# Patient Record
Sex: Male | Born: 1950 | Race: White | Hispanic: No | Marital: Married | Smoking: Never smoker
Health system: Southern US, Community
[De-identification: ages and names within clinical notes are randomized; demographics above are authoritative.]

## PROBLEM LIST (undated history)

## (undated) DIAGNOSIS — Z789 Other specified health status: Secondary | ICD-10-CM

## (undated) DIAGNOSIS — E785 Hyperlipidemia, unspecified: Secondary | ICD-10-CM

## (undated) DIAGNOSIS — I219 Acute myocardial infarction, unspecified: Secondary | ICD-10-CM

## (undated) DIAGNOSIS — I2510 Atherosclerotic heart disease of native coronary artery without angina pectoris: Secondary | ICD-10-CM

## (undated) HISTORY — DX: Atherosclerotic heart disease of native coronary artery without angina pectoris: I25.10

## (undated) HISTORY — DX: Hyperlipidemia, unspecified: E78.5

---
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
## 2002-10-05 ENCOUNTER — Ambulatory Visit (HOSPITAL_COMMUNITY): Admission: RE | Admit: 2002-10-05 | Discharge: 2002-10-05 | Payer: Self-pay | Admitting: Internal Medicine

## 2006-03-28 ENCOUNTER — Emergency Department (HOSPITAL_COMMUNITY): Admission: EM | Admit: 2006-03-28 | Discharge: 2006-03-28 | Payer: Self-pay | Admitting: Emergency Medicine

## 2012-02-02 ENCOUNTER — Encounter (HOSPITAL_COMMUNITY): Payer: Self-pay

## 2012-02-02 ENCOUNTER — Emergency Department (HOSPITAL_COMMUNITY): Payer: Federal, State, Local not specified - PPO

## 2012-02-02 ENCOUNTER — Emergency Department (HOSPITAL_COMMUNITY)
Admission: EM | Admit: 2012-02-02 | Discharge: 2012-02-02 | Disposition: A | Discharge status: Home / Self Care | Payer: Federal, State, Local not specified - PPO | Attending: Emergency Medicine | Admitting: Emergency Medicine

## 2012-02-02 DIAGNOSIS — M79609 Pain in unspecified limb: Secondary | ICD-10-CM | POA: Insufficient documentation

## 2012-02-02 DIAGNOSIS — M722 Plantar fascial fibromatosis: Secondary | ICD-10-CM

## 2012-02-02 MED ORDER — HYDROCODONE-ACETAMINOPHEN 5-325 MG PO TABS: 1.0000 | ORAL_TABLET | Freq: Four times a day (QID) | ORAL | Status: AC | PRN

## 2012-02-02 MED ORDER — NAPROXEN 500 MG PO TABS: 500.0000 mg | ORAL_TABLET | Freq: Two times a day (BID) | ORAL | Status: DC

## 2012-02-02 NOTE — ED Notes (Signed)
Was getting off tractor today and thinks he may have stepped wrong, cont. To have right foot pain when he "moves certain ways"

## 2012-02-02 NOTE — ED Provider Notes (Signed)
History  This chart was scribed for Philip Kras, MD by Ladona Ridgel Day. This patient was seen in room APFT22/APFT22 and the patient's care was started at 1411.  CSN: [id number]  Arrival date & time 02/02/12  1359   First MD Initiated Contact with Patient 02/02/12 1411      Chief Complaint  Patient presents with  . Foot Pain    Patient is a 61 y.o. male presenting with lower extremity pain. The history is provided by the patient. No language interpreter was used.  Foot Pain   Philip Daniels is a 61 y.o. male who presents to the Emergency Department complaining of sudden sharp right foot pain after he stepped off his tractor wrong. He denies numbness in his foot or any other injury or illnesses at this time.  History reviewed. No pertinent past medical history.  Past Surgical History  Procedure Date  . Tonsillectomy     No family history on file.  History  Substance Use Topics  . Smoking status: Never Smoker   . Smokeless tobacco: Not on file  . Alcohol Use: No      Review of Systems  Musculoskeletal:       Right dorsal foot pain.   Neurological: Negative for numbness.    Allergies  Review of patient's allergies indicates no known allergies.  Home Medications  No current outpatient prescriptions on file.  Triage Vitals: BP 143/72  Pulse 60  Temp 98.3 F (36.8 C) (Oral)  Resp 20  Ht 5\' 10"  (1.778 m)  Wt 160 lb (72.576 kg)  BMI 22.96 kg/m2  SpO2 100%  Physical Exam  Nursing note and vitals reviewed. Constitutional: He appears well-developed and well-nourished. No distress.  HENT:  Head: Normocephalic and atraumatic.  Right Ear: External ear normal.  Left Ear: External ear normal.  Eyes: Conjunctivae are normal. Right eye exhibits no discharge. Left eye exhibits no discharge. No scleral icterus.  Neck: Neck supple. No tracheal deviation present.  Cardiovascular: Normal rate.   Pulmonary/Chest: Effort normal. No stridor. No respiratory distress.    Musculoskeletal: He exhibits no edema.       Right ankle: Normal. no tenderness.       Right foot: He exhibits tenderness (mild plantar fascia region). He exhibits no bony tenderness and no swelling.  Neurological: He is alert. Cranial nerve deficit: no gross deficits.  Skin: Skin is warm and dry. No rash noted.  Psychiatric: He has a normal mood and affect.    ED Course  Procedures (including critical care time) DIAGNOSTIC STUDIES: Oxygen Saturation is 100% on room air, normal by my interpretation.    COORDINATION OF CARE: At 33 Discussed treatment plan with patient which includes right foot X-ray. Patient agrees.   Labs Reviewed - No data to display Dg Foot Complete Right  02/02/2012  *RADIOLOGY REPORT*  Clinical Data: Pain at bottom of foot with weightbearing.  Twisting injury this morning.  RIGHT FOOT COMPLETE - 3+ VIEW  Comparison: None.  Findings: No acute fracture or dislocation.  Definite soft tissue swelling.  IMPRESSION: No acute osseous abnormality.  Original Report Authenticated By: Consuello Bossier, M.D.       MDM   no evidence of fracture or dislocation on his x-rays. Symptoms may be associated with a plantar fascia strain. Patient will be prescribed anti-inflammatory agents and I will provide him crutches to help rest the area.  Patient can follow up with an orthopedist as needed if the symptoms do not resolve over  the next week or so. I personally performed the services described in this documentation, which was scribed in my presence.  The recorded information has been reviewed and considered.       Philip Kras, MD 02/02/12 [phone]

## 2012-02-02 NOTE — ED Notes (Signed)
Pt reports stepped down off of a tractor tire and started having pain in top and bottom of r foot.   Foot warm to touch, pulse present, can wiggle toes, and capillary refill WNL.  No obvious swelling noted.

## 2012-02-02 NOTE — ED Notes (Signed)
Patient reports he has crutches at home in good condition that he used for at previous injury.

## 2012-11-04 ENCOUNTER — Telehealth: Payer: Self-pay

## 2012-11-04 NOTE — Telephone Encounter (Signed)
Pt called today because he got a letter in the mail to set-up a TCS. He is not having any problems.

## 2012-11-04 NOTE — Telephone Encounter (Signed)
Tried to call pt and phone number listed is not a working number.

## 2012-11-04 NOTE — Telephone Encounter (Signed)
Called cell number of [phone]. Pt had last colonoscopy 10/05/2002 by NUR. He was not aware that NUR was no longer here. I told him our doctors would be glad to do it if he would like or I could give him NUR's number if he preferred. He said he was not where he could write a umber down now, he will think about it and call me back. He was on the recall list for next colonoscopy.

## 2012-11-06 ENCOUNTER — Encounter (INDEPENDENT_AMBULATORY_CARE_PROVIDER_SITE_OTHER): Payer: Self-pay

## 2012-11-06 ENCOUNTER — Telehealth (INDEPENDENT_AMBULATORY_CARE_PROVIDER_SITE_OTHER): Payer: Self-pay | Admitting: *Deleted

## 2012-11-06 ENCOUNTER — Other Ambulatory Visit (INDEPENDENT_AMBULATORY_CARE_PROVIDER_SITE_OTHER): Payer: Self-pay | Admitting: *Deleted

## 2012-11-06 DIAGNOSIS — Z1211 Encounter for screening for malignant neoplasm of colon: Secondary | ICD-10-CM

## 2012-11-06 DIAGNOSIS — Z800 Family history of malignant neoplasm of digestive organs: Secondary | ICD-10-CM

## 2012-11-06 MED ORDER — PEG-KCL-NACL-NASULF-NA ASC-C 100 G PO SOLR: 1.0000 | Freq: Once | ORAL | Status: DC

## 2012-11-06 NOTE — Telephone Encounter (Signed)
Patient needs movi prep 

## 2012-11-21 IMAGING — CR DG FOOT COMPLETE 3+V*R*
3 series · 3 of 3 positions shown · non-contrast
Comparison: None.

CLINICAL DATA: Pain at bottom of foot with weightbearing.  Twisting
injury this morning.

RIGHT FOOT COMPLETE - 3+ VIEW

[view not recorded (1 of 3)]
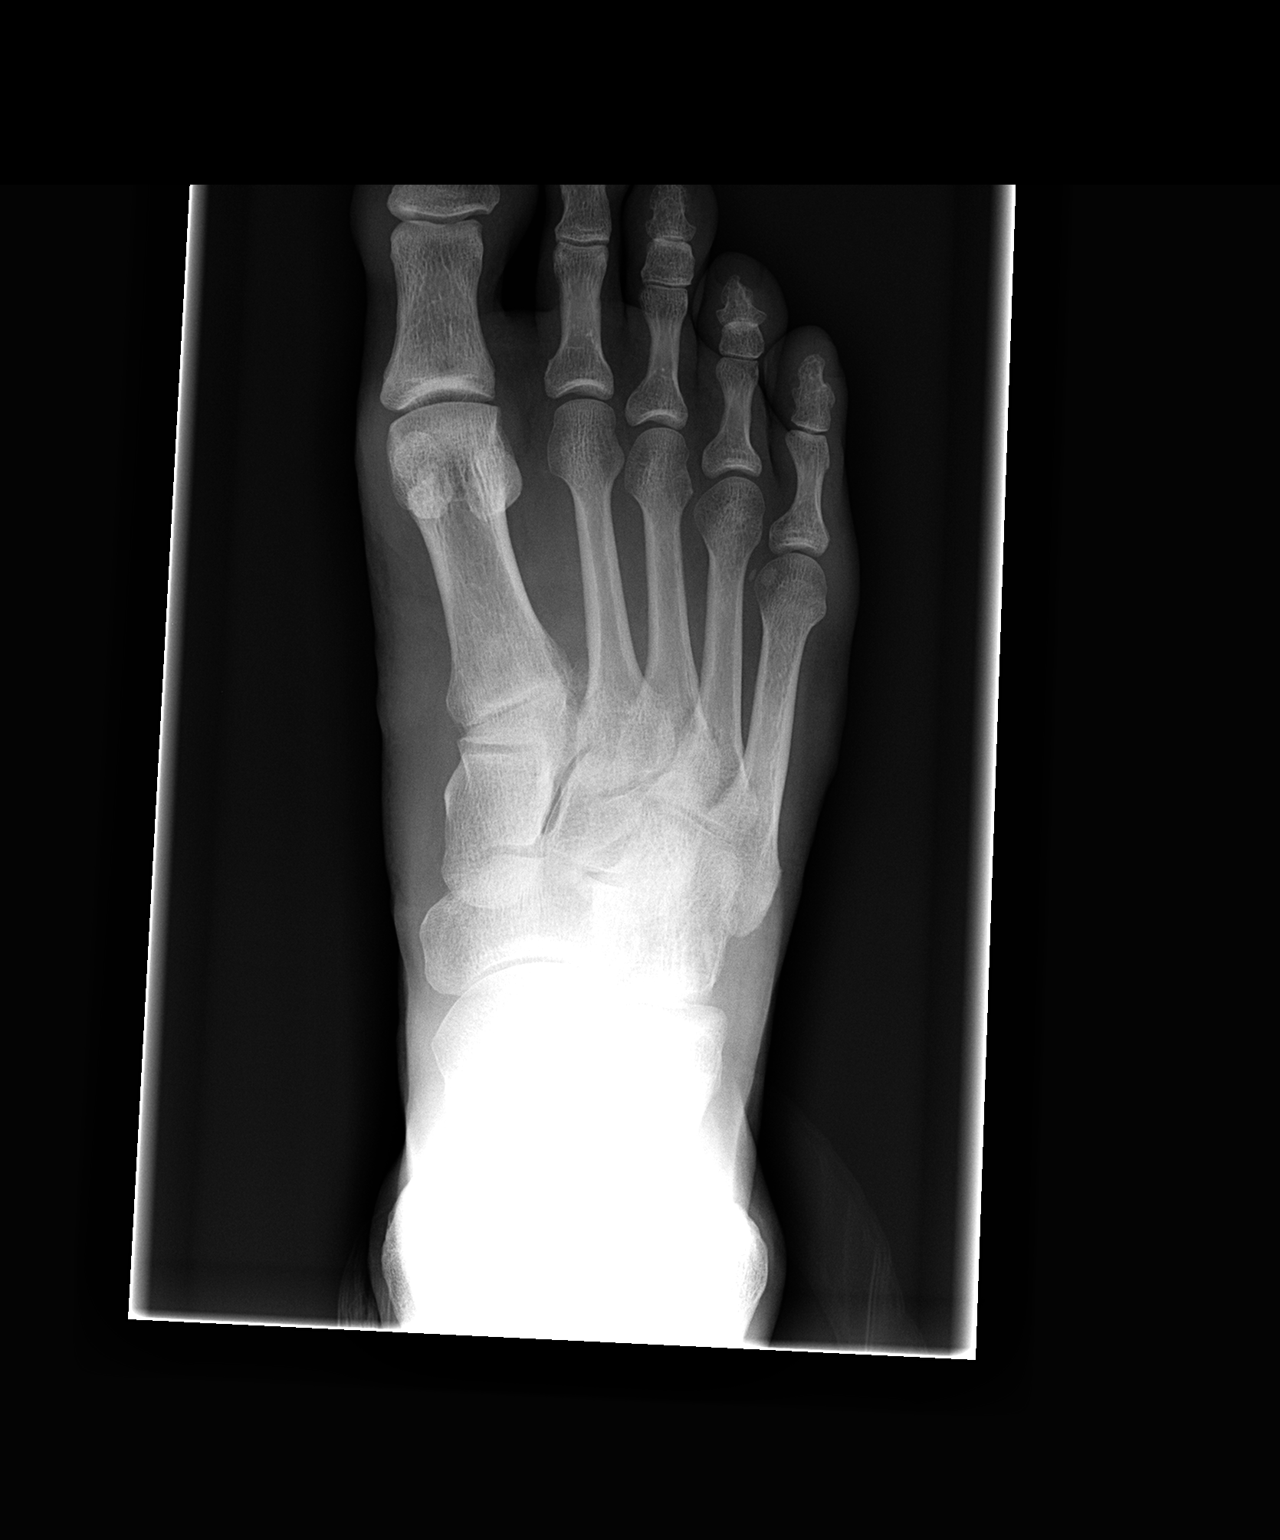

[view not recorded (2 of 3)]
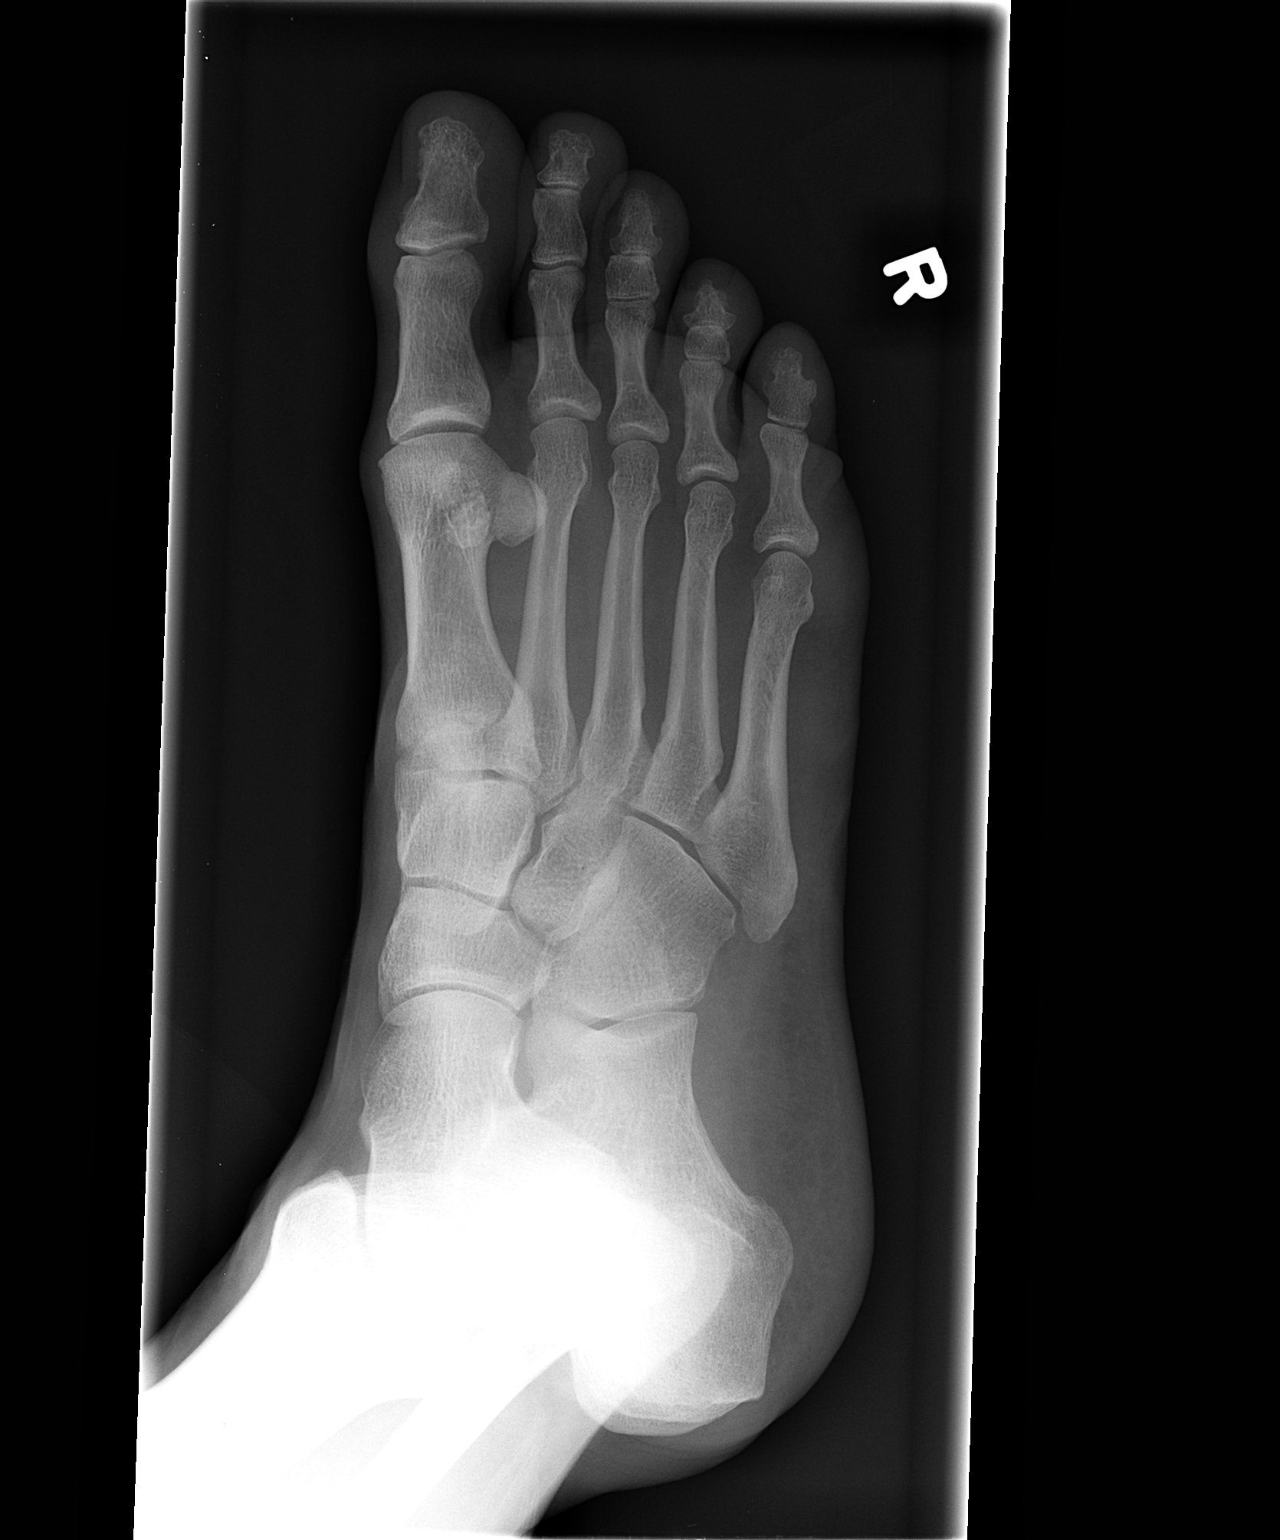

[view not recorded (3 of 3)]
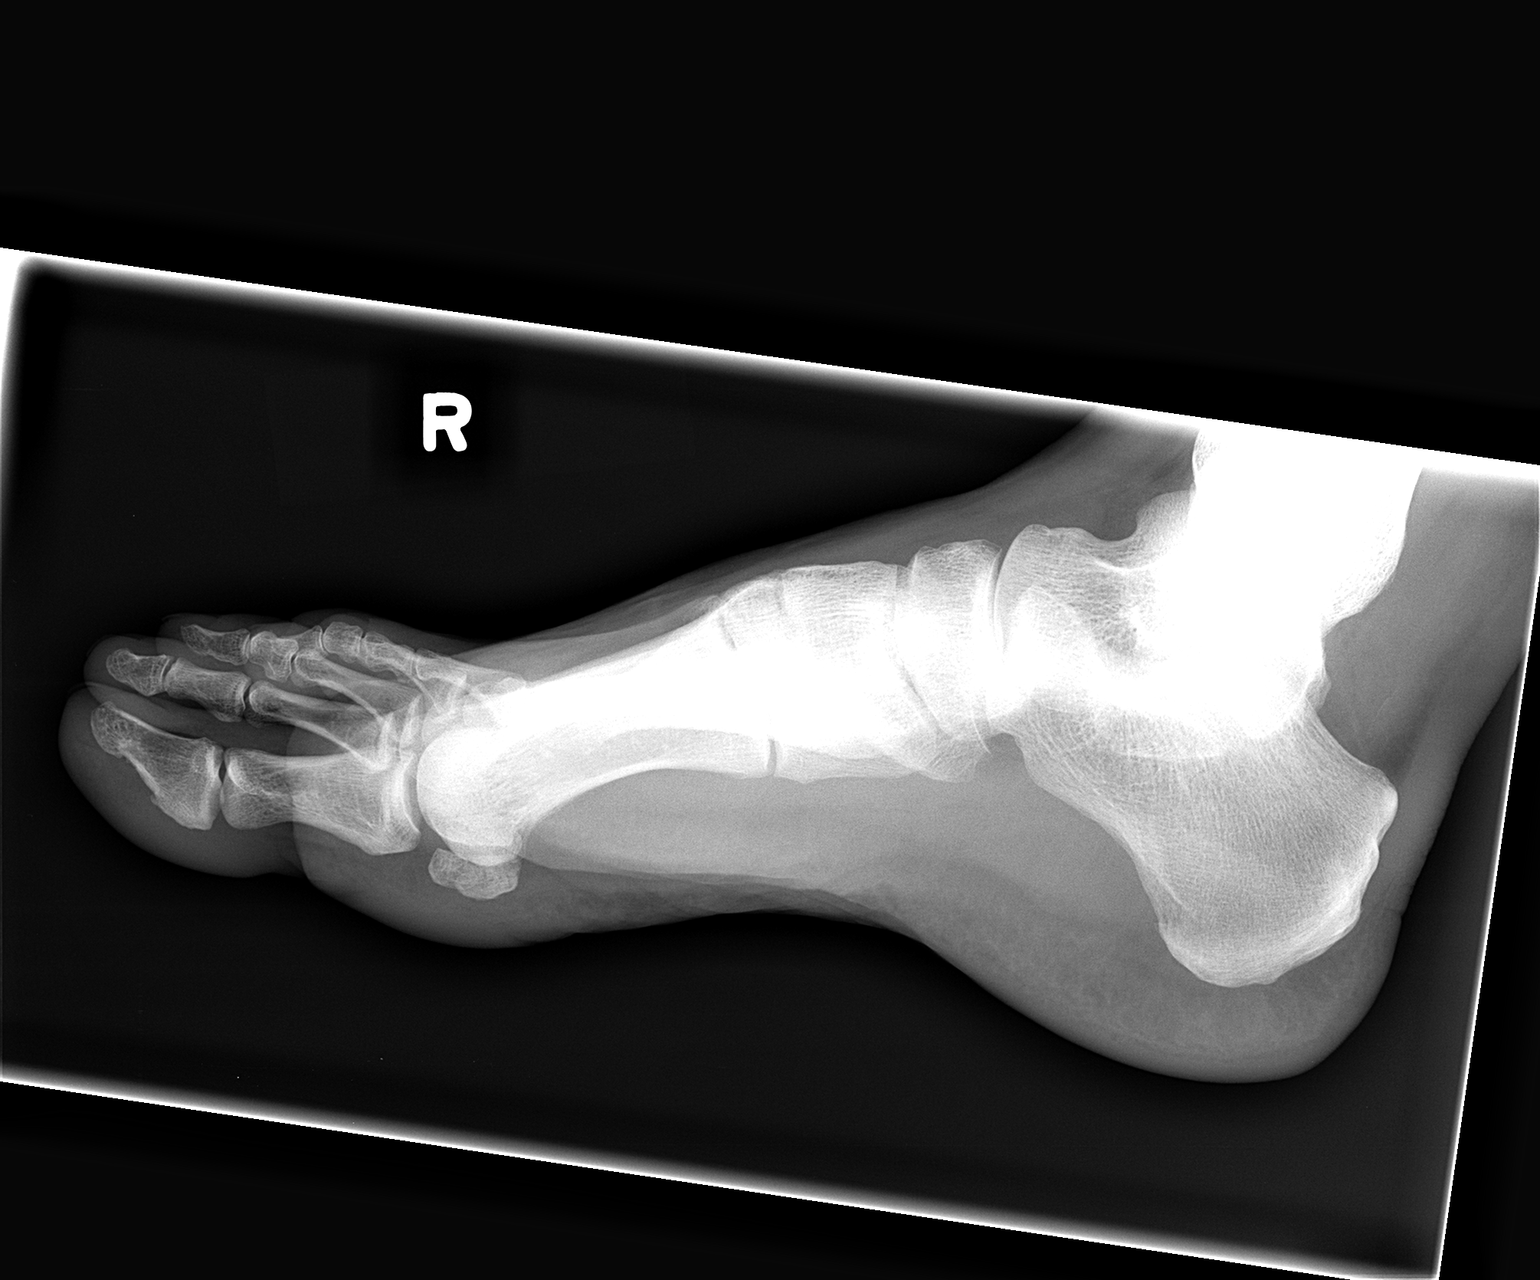

[3 of 3 positions shown; findings below may reference images not displayed]

FINDINGS: No acute fracture or dislocation.  Definite soft tissue
swelling.
IMPRESSION: No acute osseous abnormality.

## 2012-11-27 ENCOUNTER — Telehealth (INDEPENDENT_AMBULATORY_CARE_PROVIDER_SITE_OTHER): Payer: Self-pay | Admitting: *Deleted

## 2012-11-27 NOTE — Telephone Encounter (Signed)
agree

## 2012-11-27 NOTE — Telephone Encounter (Signed)
  Procedure: tcs  Reason/Indication:  Screening, fam hx colon ca  Has patient had this procedure before?  Yes, 09/2002, scanned  If so, when, by whom and where?    Is there a family history of colon cancer?  Yes, aunt  Who?  What age when diagnosed?    Is patient diabetic?   no      Does patient have prosthetic heart valve?  no  Do you have a pacemaker?  no  Has patient ever had endocarditis? no  Has patient had joint replacement within last 12 months?  no  Is patient on Coumadin, Plavix and/or Aspirin? no  Medications: vitamins  Allergies: nkda  Medication Adjustment:   Procedure date & time: 12/24/12 at 730

## 2012-12-09 ENCOUNTER — Encounter (HOSPITAL_COMMUNITY): Payer: Self-pay | Admitting: Pharmacy Technician

## 2012-12-24 ENCOUNTER — Encounter (HOSPITAL_COMMUNITY): Payer: Self-pay | Admitting: *Deleted

## 2012-12-24 ENCOUNTER — Encounter (HOSPITAL_COMMUNITY)
Admission: RE | Disposition: A | Discharge status: Home / Self Care | Payer: Self-pay | Source: Ambulatory Visit | Attending: Internal Medicine

## 2012-12-24 ENCOUNTER — Ambulatory Visit (HOSPITAL_COMMUNITY)
Admission: RE | Admit: 2012-12-24 | Discharge: 2012-12-24 | Disposition: A | Discharge status: Home / Self Care | Payer: Federal, State, Local not specified - PPO | Source: Ambulatory Visit | Attending: Internal Medicine | Admitting: Internal Medicine

## 2012-12-24 DIAGNOSIS — Z1211 Encounter for screening for malignant neoplasm of colon: Secondary | ICD-10-CM

## 2012-12-24 DIAGNOSIS — Z800 Family history of malignant neoplasm of digestive organs: Secondary | ICD-10-CM

## 2012-12-24 HISTORY — PX: COLONOSCOPY: SHX5424

## 2012-12-24 HISTORY — DX: Other specified health status: Z78.9

## 2012-12-24 SURGERY — COLONOSCOPY
Anesthesia: Moderate Sedation

## 2012-12-24 MED ORDER — MEPERIDINE HCL 50 MG/ML IJ SOLN
INTRAMUSCULAR | Status: AC
  Filled 2012-12-24: qty 1

## 2012-12-24 MED ORDER — MIDAZOLAM HCL 5 MG/5ML IJ SOLN
INTRAMUSCULAR | Status: AC
  Filled 2012-12-24: qty 10

## 2012-12-24 MED ORDER — MEPERIDINE HCL 50 MG/ML IJ SOLN
INTRAMUSCULAR | Status: DC | PRN
  Administered 2012-12-24 (×2): 25 mg via INTRAVENOUS

## 2012-12-24 MED ORDER — SODIUM CHLORIDE 0.9 % IV SOLN
INTRAVENOUS | Status: DC
  Administered 2012-12-24: 07:00:00 via INTRAVENOUS

## 2012-12-24 MED ORDER — MIDAZOLAM HCL 5 MG/5ML IJ SOLN
INTRAMUSCULAR | Status: DC | PRN
  Administered 2012-12-24 (×4): 2 mg via INTRAVENOUS

## 2012-12-24 MED ORDER — STERILE WATER FOR IRRIGATION IR SOLN
Status: DC | PRN
  Administered 2012-12-24: 07:00:00

## 2012-12-24 NOTE — Op Note (Signed)
COLONOSCOPY PROCEDURE REPORT  PATIENT:  Philip Daniels  MR#:  [id number] Birthdate:  July 01, 1951, 62 y.o., male Endoscopist:  Dr. Malissa Hippo, MD Referred By:  Dr. Carylon Perches, MD Procedure Date: 12/24/2012  Procedure:   Colonoscopy  Indications: Patient is 62 year old Caucasian male who is undergoing average risk screening colonoscopy. Family history  Is significant for colon carcinoma in paternal aunt who was in her 52s at the time of diagnosis.  Informed Consent:  The procedure and risks were reviewed with the patient and informed consent was obtained.  Medications:  Demerol 50 mg IV Versed 8 mg IV  Description of procedure:  After a digital rectal exam was performed, that colonoscope was advanced from the anus through the rectum and colon to the area of the cecum, ileocecal valve and appendiceal orifice. The cecum was deeply intubated. These structures were well-seen and photographed for the record. From the level of the cecum and ileocecal valve, the scope was slowly and cautiously withdrawn. The mucosal surfaces were carefully surveyed utilizing scope tip to flexion to facilitate fold flattening as needed. The scope was pulled down into the rectum where a thorough exam including retroflexion was performed.  Findings:  Prep satisfactory. Normal mucosa of the colon and rectum. Unremarkable anorectal junction.   Therapeutic/Diagnostic Maneuvers Performed:   None  Complications:  None  Cecal Withdrawal Time:  11 minutes  Impression:  Normal colonoscopy.  Recommendations:  Standard instructions given. Next screening exam in 10 years.   REHMAN,NAJEEB U  12/24/2012 8:18 AM  CC: Dr. Carylon Perches, MD & Dr. Bonnetta Barry ref. provider found

## 2012-12-24 NOTE — H&P (Signed)
Philip Daniels is an 62 y.o. male.   Chief Complaint: Patient's here for colonoscopy. HPI: Patient 33-year-old Caucasian male is here for screening colonoscopy. His last exam was 10 years ago. He denies abdominal pain change in his bowel habits or rectal bleeding. Family history significant for colon carcinoma in a paternal aunt she is in her 2s. Father had colonic polyps.  Past Medical History  Diagnosis Date  . Medical history non-contributory     Past Surgical History  Procedure Laterality Date  . Tonsillectomy  1970    Family History  Problem Relation Age of Onset  . Colon cancer Paternal Aunt    Social History:  reports that he has never smoked. He does not have any smokeless tobacco history on file. He reports that he does not drink alcohol or use illicit drugs.  Allergies: No Known Allergies  Medications Prior to Admission  Medication Sig Dispense Refill  . clobetasol cream (TEMOVATE) 0.05 % Apply 1 application topically 2 (two) times daily.      . Multiple Vitamin (MULTIVITAMIN WITH MINERALS) TABS Take 1 tablet by mouth daily.      . peg 3350 powder (MOVIPREP) 100 G SOLR Take 1 kit (100 g total) by mouth once.  1 kit  0  . vitamin C (ASCORBIC ACID) 500 MG tablet Take 500 mg by mouth daily.        No results found for this or any previous visit (from the past 48 hour(s)). No results found.  ROS  Blood pressure 134/82, temperature 97.7 F (36.5 C), temperature source Oral, resp. rate 24, height 5\' 10"  (1.778 m), weight 165 lb (74.844 kg), SpO2 99.00%. Physical Exam  Constitutional: He appears well-developed and well-nourished.  HENT:  Mouth/Throat: Oropharynx is clear and moist.  Eyes: Conjunctivae are normal. No scleral icterus.  Neck: No thyromegaly present.  Cardiovascular: Normal rate, regular rhythm and normal heart sounds.   No murmur heard. Respiratory: Effort normal and breath sounds normal.  GI: Soft. He exhibits no distension and no mass. There is no  tenderness.  Musculoskeletal: He exhibits no edema.  Lymphadenopathy:    He has no cervical adenopathy.  Neurological: He is alert.  Skin: Skin is warm and dry.     Assessment/Plan Average risk screening colonoscopy.  Philip Daniels U 12/24/2012, 7:32 AM

## 2012-12-26 ENCOUNTER — Encounter (HOSPITAL_COMMUNITY): Payer: Self-pay | Admitting: Internal Medicine

## 2019-09-15 ENCOUNTER — Other Ambulatory Visit: Payer: Self-pay | Admitting: Internal Medicine

## 2019-09-15 DIAGNOSIS — R7989 Other specified abnormal findings of blood chemistry: Secondary | ICD-10-CM

## 2019-09-17 ENCOUNTER — Other Ambulatory Visit: Payer: Self-pay

## 2019-09-17 ENCOUNTER — Ambulatory Visit (HOSPITAL_COMMUNITY)
Admission: RE | Admit: 2019-09-17 | Discharge: 2019-09-17 | Disposition: A | Discharge status: Home / Self Care | Payer: Medicare HMO | Source: Ambulatory Visit | Attending: Internal Medicine | Admitting: Internal Medicine

## 2019-09-17 DIAGNOSIS — R7989 Other specified abnormal findings of blood chemistry: Secondary | ICD-10-CM | POA: Diagnosis not present

## 2019-09-17 DIAGNOSIS — N281 Cyst of kidney, acquired: Secondary | ICD-10-CM | POA: Diagnosis not present

## 2019-09-17 DIAGNOSIS — N400 Enlarged prostate without lower urinary tract symptoms: Secondary | ICD-10-CM | POA: Diagnosis not present

## 2020-07-06 IMAGING — US US RENAL
1 series · 14 of 25 positions shown · non-contrast
Comparison: None

CLINICAL DATA: Elevated creatinine

EXAM:
RENAL / URINARY TRACT ULTRASOUND COMPLETE

[Series 1: us renal · 0.24mm/px · 14 of 111 slices shown]
[im 1/111]
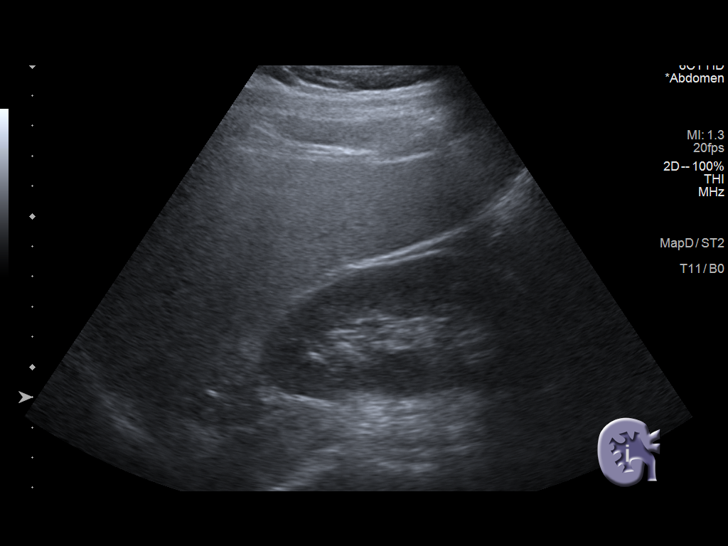
[im 10/111]
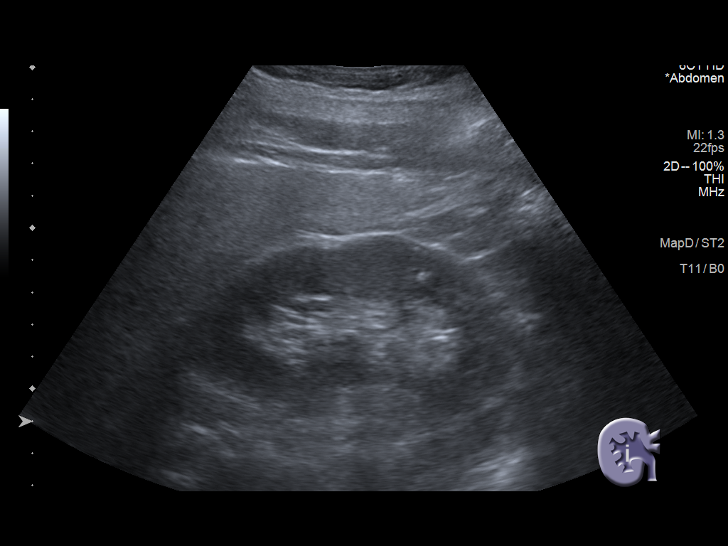
[im 19/111]
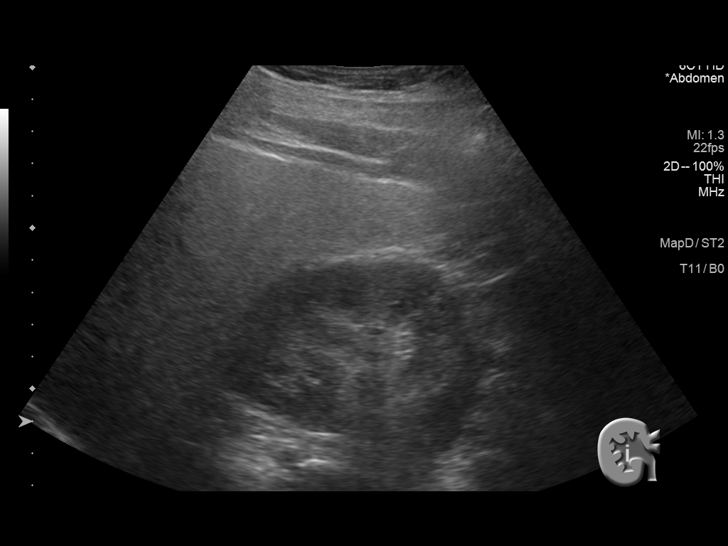
[im 28/111]
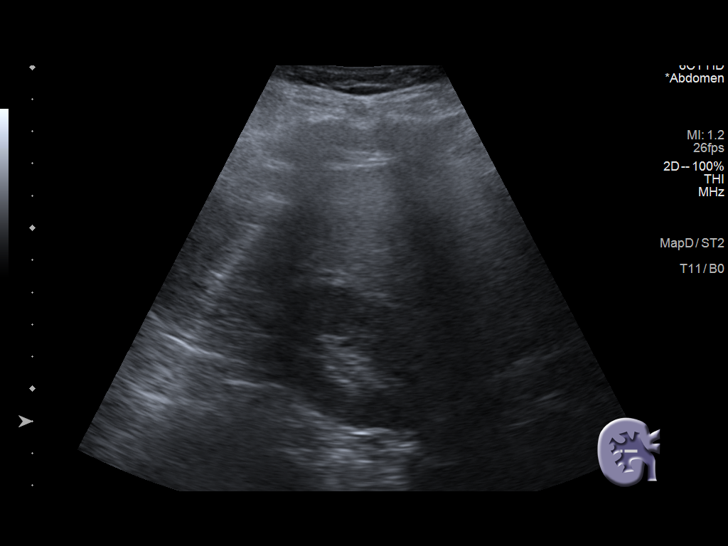
[im 37/111]
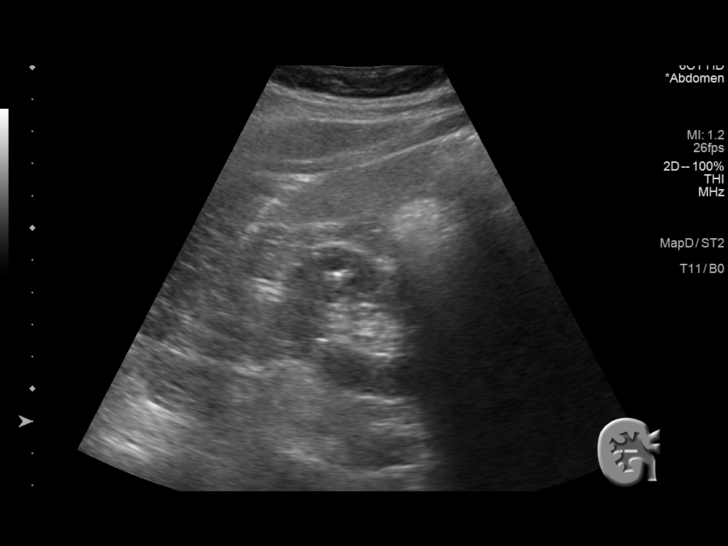
[im 42/111]
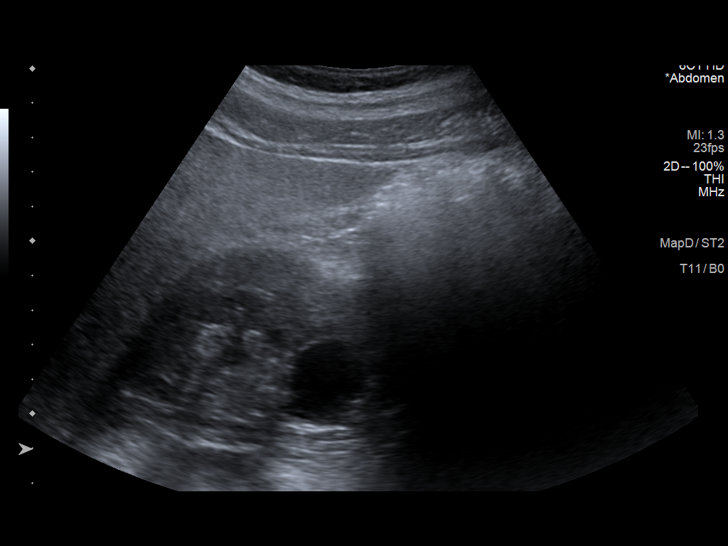
[im 51/111]
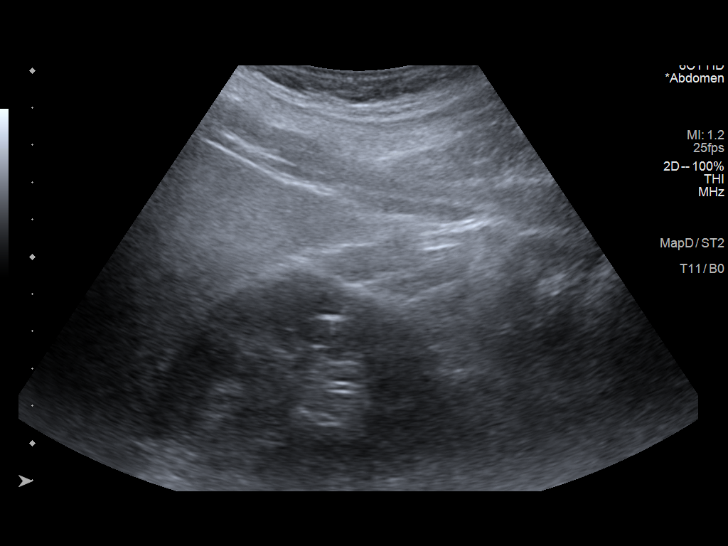
[im 60/111]
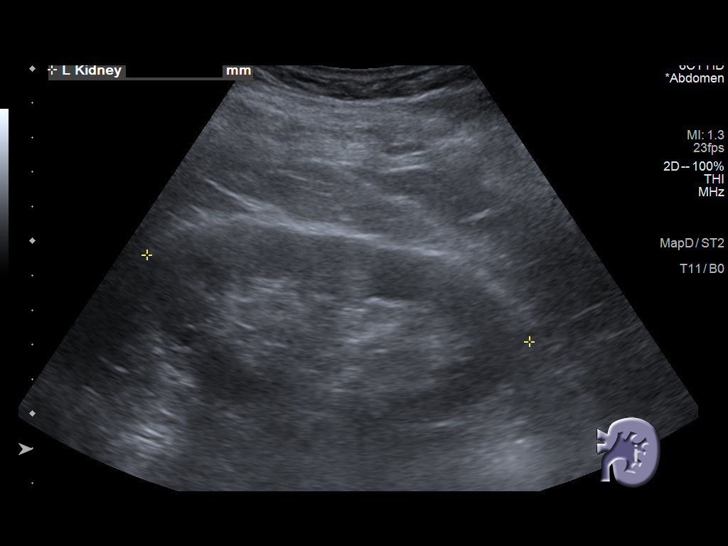
[im 69/111]
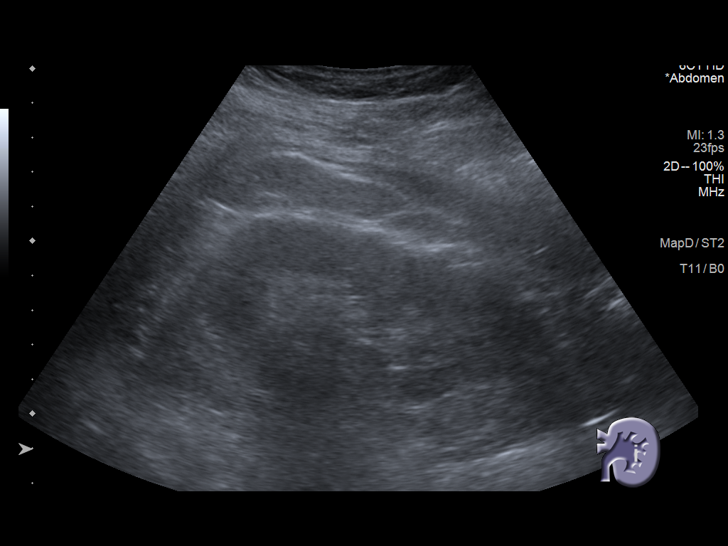
[im 74/111]
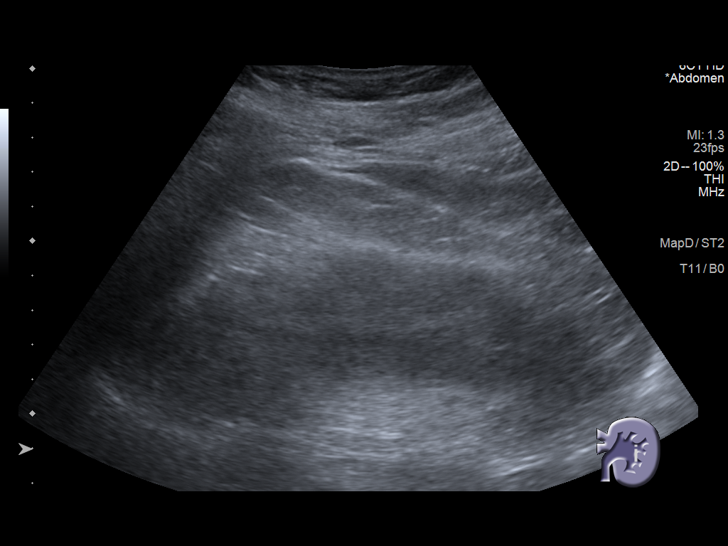
[im 83/111]
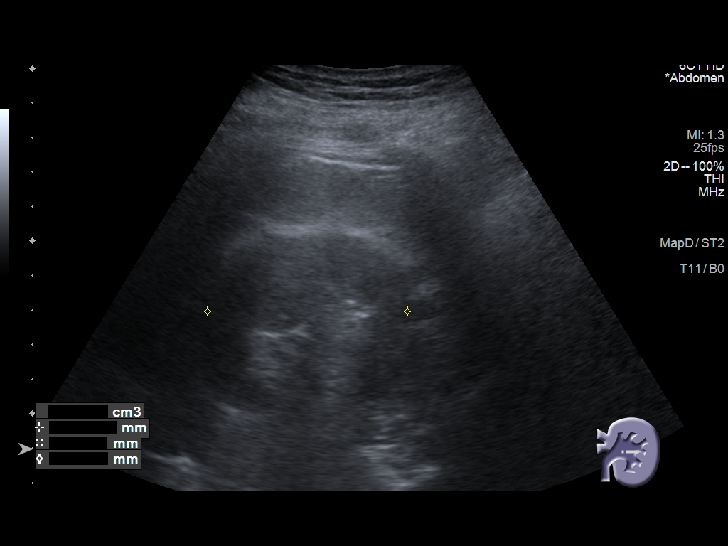
[im 92/111]
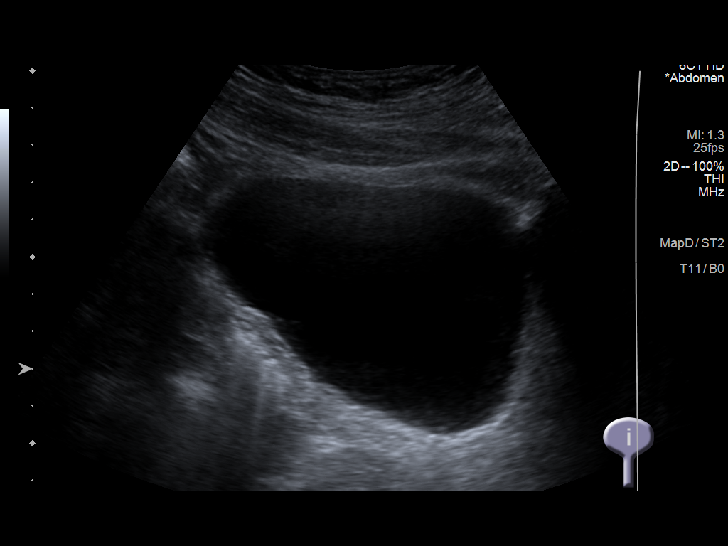
[im 101/111]
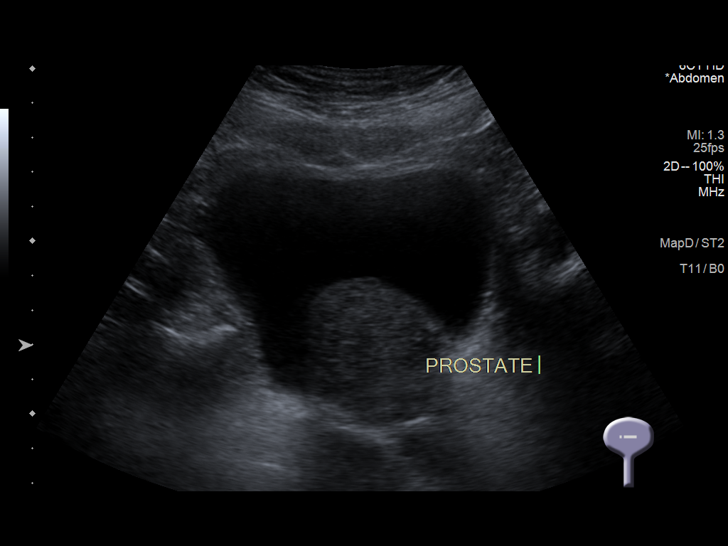
[im 111/111]
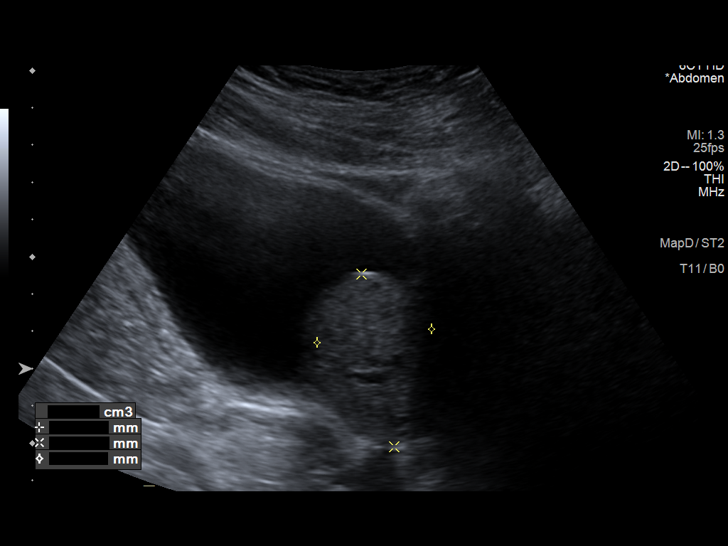

[14 of 25 positions shown; findings below may reference images not displayed]

FINDINGS: Right Kidney:

Renal measurements: 10.9 x 4.7 x 5.0 cm = volume: 134 mL. Normal
cortical thickness and echogenicity. Small cyst at inferior pole
x 2.1 x 1.9 cm. 8 mm nonshadowing echogenic focus mid RIGHT kidney,
nonspecific, could represent a nonshadowing calculus or an artifact.
No additional mass or hydronephrosis.

Left Kidney:

Renal measurements: 11.4 x 5.1 x 5.8 cm = volume: 174 mL. Normal
cortical thickness. Upper normal cortical echogenicity. No mass,
hydronephrosis, or shadowing calcification.

Bladder:

Appears normal for degree of bladder distention.

Other:

Enlargement of prostate gland, 4.3 x 4.7 x 3.1 cm with a calculated
volume of 33 mL.

Increased hepatic echogenicity with smooth margins suspect fatty
infiltration.
IMPRESSION: 2.7 cm diameter inferior pole RIGHT renal cyst.

Question 8 mm nonshadowing calculus mid RIGHT kidney.

Prostatic enlargement as above.

Suspected fatty infiltration of liver.

## 2021-03-15 ENCOUNTER — Inpatient Hospital Stay (HOSPITAL_COMMUNITY)
Admission: EM | Disposition: A | Discharge status: Home / Self Care | Payer: Self-pay | Source: Home / Self Care | Attending: Cardiology

## 2021-03-15 ENCOUNTER — Inpatient Hospital Stay (HOSPITAL_COMMUNITY): Payer: Medicare HMO

## 2021-03-15 ENCOUNTER — Inpatient Hospital Stay (HOSPITAL_COMMUNITY)
Admission: EM | Admit: 2021-03-15 | Discharge: 2021-03-16 | DRG: 247 | Disposition: A | Discharge status: Home / Self Care | Payer: Medicare HMO | Attending: Cardiology | Admitting: Cardiology

## 2021-03-15 ENCOUNTER — Encounter (HOSPITAL_COMMUNITY): Payer: Self-pay

## 2021-03-15 ENCOUNTER — Emergency Department (HOSPITAL_COMMUNITY): Payer: Medicare HMO

## 2021-03-15 ENCOUNTER — Other Ambulatory Visit: Payer: Self-pay

## 2021-03-15 DIAGNOSIS — N179 Acute kidney failure, unspecified: Secondary | ICD-10-CM | POA: Diagnosis present

## 2021-03-15 DIAGNOSIS — Z20822 Contact with and (suspected) exposure to covid-19: Secondary | ICD-10-CM | POA: Diagnosis present

## 2021-03-15 DIAGNOSIS — I25110 Atherosclerotic heart disease of native coronary artery with unstable angina pectoris: Secondary | ICD-10-CM | POA: Diagnosis present

## 2021-03-15 DIAGNOSIS — R079 Chest pain, unspecified: Secondary | ICD-10-CM

## 2021-03-15 DIAGNOSIS — Z8249 Family history of ischemic heart disease and other diseases of the circulatory system: Secondary | ICD-10-CM

## 2021-03-15 DIAGNOSIS — I214 Non-ST elevation (NSTEMI) myocardial infarction: Principal | ICD-10-CM | POA: Diagnosis present

## 2021-03-15 DIAGNOSIS — I2510 Atherosclerotic heart disease of native coronary artery without angina pectoris: Secondary | ICD-10-CM | POA: Diagnosis not present

## 2021-03-15 DIAGNOSIS — I472 Ventricular tachycardia: Secondary | ICD-10-CM | POA: Diagnosis present

## 2021-03-15 DIAGNOSIS — Z955 Presence of coronary angioplasty implant and graft: Secondary | ICD-10-CM

## 2021-03-15 HISTORY — PX: LEFT HEART CATH AND CORONARY ANGIOGRAPHY: CATH118249

## 2021-03-15 HISTORY — PX: CORONARY STENT INTERVENTION: CATH118234

## 2021-03-15 LAB — COMPREHENSIVE METABOLIC PANEL
ALT: 28 U/L (ref 0–44)
AST: 22 U/L (ref 15–41)
Albumin: 4.4 g/dL (ref 3.5–5.0)
Alkaline Phosphatase: 51 U/L (ref 38–126)
Anion gap: 9 (ref 5–15)
BUN: 26 mg/dL — ABNORMAL HIGH (ref 8–23)
CO2: 25 mmol/L (ref 22–32)
Calcium: 9.2 mg/dL (ref 8.9–10.3)
Chloride: 105 mmol/L (ref 98–111)
Creatinine, Ser: 1.73 mg/dL — ABNORMAL HIGH (ref 0.61–1.24)
GFR, Estimated: 42 mL/min — ABNORMAL LOW (ref >60)
Glucose, Bld: 113 mg/dL — ABNORMAL HIGH (ref 70–99)
Potassium: 3.9 mmol/L (ref 3.5–5.1)
Sodium: 139 mmol/L (ref 135–145)
Total Bilirubin: 1.2 mg/dL (ref 0.3–1.2)
Total Protein: 7.5 g/dL (ref 6.5–8.1)

## 2021-03-15 LAB — CBC WITH DIFFERENTIAL/PLATELET
Abs Immature Granulocytes: 0.01 10*3/uL (ref 0.00–0.07)
Basophils Absolute: 0.1 10*3/uL (ref 0.0–0.1)
Basophils Relative: 1 %
Eosinophils Absolute: 0.3 10*3/uL (ref 0.0–0.5)
Eosinophils Relative: 5 %
HCT: 44.5 % (ref 39.0–52.0)
Hemoglobin: 14.9 g/dL (ref 13.0–17.0)
Immature Granulocytes: 0 %
Lymphocytes Relative: 17 %
Lymphs Abs: 1.1 10*3/uL (ref 0.7–4.0)
MCH: 30.9 pg (ref 26.0–34.0)
MCHC: 33.5 g/dL (ref 30.0–36.0)
MCV: 92.3 fL (ref 80.0–100.0)
Monocytes Absolute: 0.7 10*3/uL (ref 0.1–1.0)
Monocytes Relative: 10 %
Neutro Abs: 4.4 10*3/uL (ref 1.7–7.7)
Neutrophils Relative %: 67 %
Platelets: 188 10*3/uL (ref 150–400)
RBC: 4.82 MIL/uL (ref 4.22–5.81)
RDW: 14 % (ref 11.5–15.5)
WBC: 6.6 10*3/uL (ref 4.0–10.5)
nRBC: 0 % (ref 0.0–0.2)

## 2021-03-15 LAB — LIPID PANEL
Cholesterol: 168 mg/dL (ref 0–200)
HDL: 33 mg/dL — ABNORMAL LOW (ref >40)
LDL Cholesterol: 107 mg/dL — ABNORMAL HIGH (ref 0–99)
Total CHOL/HDL Ratio: 5.1 RATIO
Triglycerides: 141 mg/dL (ref <150)
VLDL: 28 mg/dL (ref 0–40)

## 2021-03-15 LAB — LIPASE, BLOOD: Lipase: 38 U/L (ref 11–51)

## 2021-03-15 LAB — TROPONIN I (HIGH SENSITIVITY)
Troponin I (High Sensitivity): 9 ng/L (ref <18)
Troponin I (High Sensitivity): 61 ng/L — ABNORMAL HIGH (ref <18)
Troponin I (High Sensitivity): 1165 ng/L (ref <18)

## 2021-03-15 LAB — POCT ACTIVATED CLOTTING TIME: Activated Clotting Time: 271 seconds

## 2021-03-15 LAB — BASIC METABOLIC PANEL
Anion gap: 10 (ref 5–15)
BUN: 24 mg/dL — ABNORMAL HIGH (ref 8–23)
CO2: 22 mmol/L (ref 22–32)
Calcium: 8.6 mg/dL — ABNORMAL LOW (ref 8.9–10.3)
Chloride: 104 mmol/L (ref 98–111)
Creatinine, Ser: 1.47 mg/dL — ABNORMAL HIGH (ref 0.61–1.24)
GFR, Estimated: 51 mL/min — ABNORMAL LOW (ref >60)
Glucose, Bld: 111 mg/dL — ABNORMAL HIGH (ref 70–99)
Potassium: 4 mmol/L (ref 3.5–5.1)
Sodium: 136 mmol/L (ref 135–145)

## 2021-03-15 LAB — ECHOCARDIOGRAM COMPLETE
AR max vel: 2.6 cm2
AV Area VTI: 2.92 cm2
AV Area mean vel: 2.11 cm2
AV Mean grad: 7 mmHg
AV Peak grad: 11.7 mmHg
Ao pk vel: 1.71 m/s
Area-P 1/2: 4.68 cm2
Height: 69 in
P 1/2 time: 783 msec
S' Lateral: 3 cm
Weight: 2638.47 oz

## 2021-03-15 LAB — RESP PANEL BY RT-PCR (FLU A&B, COVID) ARPGX2
Influenza A by PCR: NEGATIVE
Influenza B by PCR: NEGATIVE
SARS Coronavirus 2 by RT PCR: NEGATIVE

## 2021-03-15 LAB — HEMOGLOBIN A1C
Hgb A1c MFr Bld: 6 % — ABNORMAL HIGH (ref 4.8–5.6)
Mean Plasma Glucose: 125.5 mg/dL

## 2021-03-15 LAB — MAGNESIUM: Magnesium: 2 mg/dL (ref 1.7–2.4)

## 2021-03-15 LAB — HIV ANTIBODY (ROUTINE TESTING W REFLEX): HIV Screen 4th Generation wRfx: NONREACTIVE

## 2021-03-15 SURGERY — LEFT HEART CATH AND CORONARY ANGIOGRAPHY
Anesthesia: LOCAL

## 2021-03-15 MED ORDER — HEPARIN (PORCINE) 25000 UT/250ML-% IV SOLN
1000.0000 [IU]/h | INTRAVENOUS | Status: DC
  Administered 2021-03-15: 1000 [IU]/h via INTRAVENOUS
  Filled 2021-03-15: qty 250

## 2021-03-15 MED ORDER — ASPIRIN 81 MG PO CHEW
81.0000 mg | CHEWABLE_TABLET | Freq: Every day | ORAL | Status: DC
  Administered 2021-03-16: 81 mg via ORAL
  Filled 2021-03-15: qty 1

## 2021-03-15 MED ORDER — SODIUM CHLORIDE 0.9 % IV SOLN: 250.0000 mL | INTRAVENOUS | Status: DC | PRN

## 2021-03-15 MED ORDER — SODIUM CHLORIDE 0.9 % WEIGHT BASED INFUSION
3.0000 mL/kg/h | INTRAVENOUS | Status: DC
  Administered 2021-03-15: 3 mL/kg/h via INTRAVENOUS

## 2021-03-15 MED ORDER — ATORVASTATIN CALCIUM 80 MG PO TABS: 80.0000 mg | ORAL_TABLET | Freq: Every day | ORAL | Status: DC

## 2021-03-15 MED ORDER — HEPARIN SODIUM (PORCINE) 1000 UNIT/ML IJ SOLN
INTRAMUSCULAR | Status: DC | PRN
  Administered 2021-03-15: 4000 [IU] via INTRAVENOUS
  Administered 2021-03-15: 6000 [IU] via INTRAVENOUS
  Administered 2021-03-15: 2000 [IU] via INTRAVENOUS

## 2021-03-15 MED ORDER — NITROGLYCERIN IN D5W 200-5 MCG/ML-% IV SOLN
5.0000 ug/min | INTRAVENOUS | Status: DC
  Administered 2021-03-15: 5 ug/min via INTRAVENOUS
  Filled 2021-03-15: qty 250

## 2021-03-15 MED ORDER — SODIUM CHLORIDE 0.9 % WEIGHT BASED INFUSION
3.0000 mL/kg/h | INTRAVENOUS | Status: AC
Start: 2021-03-15 — End: 2021-03-15

## 2021-03-15 MED ORDER — ONDANSETRON HCL 4 MG/2ML IJ SOLN: 4.0000 mg | Freq: Four times a day (QID) | INTRAMUSCULAR | Status: DC | PRN

## 2021-03-15 MED ORDER — SODIUM CHLORIDE 0.9% FLUSH
3.0000 mL | Freq: Two times a day (BID) | INTRAVENOUS | Status: DC
  Administered 2021-03-15 – 2021-03-16 (×2): 3 mL via INTRAVENOUS

## 2021-03-15 MED ORDER — SODIUM CHLORIDE 0.9 % WEIGHT BASED INFUSION
1.0000 mL/kg/h | INTRAVENOUS | Status: DC
Start: 2021-03-15 — End: 2021-03-15

## 2021-03-15 MED ORDER — HEPARIN SODIUM (PORCINE) 1000 UNIT/ML IJ SOLN
INTRAMUSCULAR | Status: AC
  Filled 2021-03-15: qty 1

## 2021-03-15 MED ORDER — SODIUM CHLORIDE 0.9 % IV SOLN: INTRAVENOUS | Status: AC

## 2021-03-15 MED ORDER — NITROGLYCERIN 1 MG/10 ML FOR IR/CATH LAB
INTRA_ARTERIAL | Status: AC
  Filled 2021-03-15: qty 10

## 2021-03-15 MED ORDER — FENTANYL CITRATE (PF) 100 MCG/2ML IJ SOLN
INTRAMUSCULAR | Status: DC | PRN
  Administered 2021-03-15: 25 ug via INTRAVENOUS

## 2021-03-15 MED ORDER — HEPARIN (PORCINE) IN NACL 1000-0.9 UT/500ML-% IV SOLN
INTRAVENOUS | Status: DC | PRN
  Administered 2021-03-15 (×2): 500 mL

## 2021-03-15 MED ORDER — SODIUM CHLORIDE 0.9 % IV BOLUS
1000.0000 mL | Freq: Once | INTRAVENOUS | Status: AC
  Administered 2021-03-15: 1000 mL via INTRAVENOUS

## 2021-03-15 MED ORDER — FENTANYL CITRATE (PF) 100 MCG/2ML IJ SOLN
INTRAMUSCULAR | Status: AC
  Filled 2021-03-15: qty 2

## 2021-03-15 MED ORDER — ROSUVASTATIN CALCIUM 20 MG PO TABS
40.0000 mg | ORAL_TABLET | Freq: Every day | ORAL | Status: DC
  Administered 2021-03-15: 40 mg via ORAL
  Filled 2021-03-15: qty 2

## 2021-03-15 MED ORDER — MIDAZOLAM HCL 2 MG/2ML IJ SOLN
INTRAMUSCULAR | Status: DC | PRN
  Administered 2021-03-15: 1 mg via INTRAVENOUS

## 2021-03-15 MED ORDER — IOHEXOL 350 MG/ML SOLN
INTRAVENOUS | Status: DC | PRN
  Administered 2021-03-15: 100 mL via INTRA_ARTERIAL

## 2021-03-15 MED ORDER — HYDRALAZINE HCL 20 MG/ML IJ SOLN: 10.0000 mg | INTRAMUSCULAR | Status: AC | PRN

## 2021-03-15 MED ORDER — TICAGRELOR 90 MG PO TABS
ORAL_TABLET | ORAL | Status: DC | PRN
  Administered 2021-03-15: 180 mg via ORAL

## 2021-03-15 MED ORDER — ASPIRIN 81 MG PO CHEW
324.0000 mg | CHEWABLE_TABLET | Freq: Once | ORAL | Status: AC
  Administered 2021-03-15: 324 mg via ORAL
  Filled 2021-03-15: qty 4

## 2021-03-15 MED ORDER — SODIUM CHLORIDE 0.9 % WEIGHT BASED INFUSION: 1.0000 mL/kg/h | INTRAVENOUS | Status: DC

## 2021-03-15 MED ORDER — SODIUM CHLORIDE 0.9% FLUSH: 3.0000 mL | INTRAVENOUS | Status: DC | PRN

## 2021-03-15 MED ORDER — HEPARIN (PORCINE) IN NACL 1000-0.9 UT/500ML-% IV SOLN
INTRAVENOUS | Status: AC
  Filled 2021-03-15: qty 500

## 2021-03-15 MED ORDER — LABETALOL HCL 5 MG/ML IV SOLN: 10.0000 mg | INTRAVENOUS | Status: AC | PRN

## 2021-03-15 MED ORDER — TICAGRELOR 90 MG PO TABS
90.0000 mg | ORAL_TABLET | Freq: Two times a day (BID) | ORAL | Status: DC
  Administered 2021-03-15 – 2021-03-16 (×2): 90 mg via ORAL
  Filled 2021-03-15 (×2): qty 1

## 2021-03-15 MED ORDER — LIDOCAINE HCL (PF) 1 % IJ SOLN
INTRAMUSCULAR | Status: AC
  Filled 2021-03-15: qty 30

## 2021-03-15 MED ORDER — NITROGLYCERIN 0.4 MG SL SUBL: 0.4000 mg | SUBLINGUAL_TABLET | SUBLINGUAL | Status: DC | PRN

## 2021-03-15 MED ORDER — VERAPAMIL HCL 2.5 MG/ML IV SOLN
INTRAVENOUS | Status: AC
  Filled 2021-03-15: qty 2

## 2021-03-15 MED ORDER — HEPARIN BOLUS VIA INFUSION
3500.0000 [IU] | Freq: Once | INTRAVENOUS | Status: AC
  Administered 2021-03-15: 3500 [IU] via INTRAVENOUS

## 2021-03-15 MED ORDER — ASPIRIN EC 81 MG PO TBEC: 81.0000 mg | DELAYED_RELEASE_TABLET | Freq: Every day | ORAL | Status: DC

## 2021-03-15 MED ORDER — ACETAMINOPHEN 325 MG PO TABS: 650.0000 mg | ORAL_TABLET | ORAL | Status: DC | PRN

## 2021-03-15 MED ORDER — NITROGLYCERIN 0.4 MG SL SUBL
0.4000 mg | SUBLINGUAL_TABLET | Freq: Once | SUBLINGUAL | Status: AC
  Administered 2021-03-15 (×2): 0.4 mg via SUBLINGUAL
  Filled 2021-03-15: qty 1

## 2021-03-15 MED ORDER — MIDAZOLAM HCL 2 MG/2ML IJ SOLN
INTRAMUSCULAR | Status: AC
  Filled 2021-03-15: qty 2

## 2021-03-15 MED ORDER — LIDOCAINE HCL (PF) 1 % IJ SOLN
INTRAMUSCULAR | Status: DC | PRN
  Administered 2021-03-15: 2 mL via INTRADERMAL

## 2021-03-15 MED ORDER — SODIUM CHLORIDE 0.9% FLUSH
3.0000 mL | Freq: Two times a day (BID) | INTRAVENOUS | Status: DC
  Administered 2021-03-16: 3 mL via INTRAVENOUS

## 2021-03-15 MED ORDER — VERAPAMIL HCL 2.5 MG/ML IV SOLN
INTRAVENOUS | Status: DC | PRN
  Administered 2021-03-15: 10 mL via INTRA_ARTERIAL

## 2021-03-15 MED ORDER — TICAGRELOR 90 MG PO TABS
ORAL_TABLET | ORAL | Status: AC
  Filled 2021-03-15: qty 2

## 2021-03-15 SURGICAL SUPPLY — 17 items
BALLN SAPPHIRE 2.0X12 (BALLOONS) ×2
BALLN SAPPHIRE ~~LOC~~ 2.5X15 (BALLOONS) ×1 IMPLANT
BALLOON SAPPHIRE 2.0X12 (BALLOONS) IMPLANT
CATH 5FR JL3.5 JR4 ANG PIG MP (CATHETERS) ×1 IMPLANT
CATH VISTA GUIDE 6FR XBLAD3.5 (CATHETERS) ×1 IMPLANT
DEVICE RAD COMP TR BAND LRG (VASCULAR PRODUCTS) ×1 IMPLANT
GLIDESHEATH SLEND SS 6F .021 (SHEATH) ×1 IMPLANT
GUIDEWIRE INQWIRE 1.5J.035X260 (WIRE) IMPLANT
INQWIRE 1.5J .035X260CM (WIRE) ×2
KIT ENCORE 26 ADVANTAGE (KITS) ×1 IMPLANT
KIT HEART LEFT (KITS) ×2 IMPLANT
PACK CARDIAC CATHETERIZATION (CUSTOM PROCEDURE TRAY) ×2 IMPLANT
STENT SYNERGY XD 2.25X24 (Permanent Stent) IMPLANT
SYNERGY XD 2.25X24 (Permanent Stent) ×2 IMPLANT
TRANSDUCER W/STOPCOCK (MISCELLANEOUS) ×2 IMPLANT
TUBING CIL FLEX 10 FLL-RA (TUBING) ×2 IMPLANT
WIRE COUGAR XT STRL 190CM (WIRE) ×1 IMPLANT

## 2021-03-15 NOTE — Interval H&P Note (Signed)
History and Physical Interval Note:  03/15/2021 1:33 PM  Philip Daniels L [place]  has presented today for surgery, with the diagnosis of nstemi.  The various methods of treatment have been discussed with the patient and family. After consideration of risks, benefits and other options for treatment, the patient has consented to  Procedure(s): LEFT HEART CATH AND CORONARY ANGIOGRAPHY (N/A) as a surgical intervention.  The patient's history has been reviewed, patient examined, no change in status, stable for surgery.  I have reviewed the patient's chart and labs.  Questions were answered to the patient's satisfaction.    Cath Lab Visit (complete for each Cath Lab visit)  Clinical Evaluation Leading to the Procedure:   ACS: Yes.    Non-ACS:    Anginal Classification: CCS IV  Anti-ischemic medical therapy: No Therapy  Non-Invasive Test Results: No non-invasive testing performed  Prior CABG: No previous CABG        Verne Carrow

## 2021-03-15 NOTE — H&P (Addendum)
Cardiology Admission History and Physical:   Patient ID: KYSHAWN TEAL MRN: [id number]; DOB: [birth year]   Admission date: 03/15/2021  PCP:  Carylon Perches, MD   [facility] Providers Cardiologist: New to [facility]   Chief Complaint: Chest Pain  Patient Profile:   Philip Daniels is a 70 y.o. male with past medical history of known renal insufficiency and family history of CAD who is being seen today for the evaluation of an chest pain and elevated troponin values at the request of Dr. Manus Gunning.  History of Present Illness:   Philip Daniels presented to [facility] ED during the early morning hours of 03/15/2021 for evaluation of chest pain which awoke him from sleep. 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 He was in his normal state of health until early this morning when he developed chest tightness which awoke him from sleep. He denies any associated radiating pain, nausea, vomiting or diaphoresis. The tightness persisted which prompted him to come to the ED. He was given SL NTG x2 with resolution of his symptoms. He does report recurrent tightness is starting to develop at this time and has a NTG drip ordered.   No recent orthopnea, PND or lower extremity edema. No known HTN, HLD or Type 2 DM. Says he was told in the past he did have an abnormal creatinine but this normalized by repeat labs. Denies any prior tobacco use. He did have a physical earlier this year with his PCP (Dr. Ouida Sills). He reports a strong family history of CAD in his father who had CABG when he was in his 27's.  Initial labs show WBC 6.6, Hgb 14.9, platelets 188, Na+ 139, K+ 3.9 and creatinine 1.73. Lipase 38. Initial Hs Troponin 9 with repeat of 61. COVID negativ e. CXR with no active disease. EKG shows sinus bradycardia, HR 50 with no distinct ST abnormalities. No prior tracings available for comparison.     Past Medical History:  Diagnosis Date   Medical history non-contributory     Past Surgical History:  Procedure Laterality Date   COLONOSCOPY N/A 12/24/2012   Procedure: COLONOSCOPY;  Surgeon: Malissa Hippo, MD;  Location: [facility];  Service: Endoscopy;  Laterality: N/A;  730   TONSILLECTOMY  1970     Medications Prior to Admission: Prior to Admission medications   Medication Sig Start Date End Date Taking? Authorizing Provider  Multiple Vitamin (MULTIVITAMIN WITH MINERALS) TABS Take 1 tablet by mouth daily.   Yes [provider]  vitamin C (ASCORBIC ACID) 500 MG tablet Take 500 mg by mouth daily.   Yes [provider]     Allergies:   No Known Allergies  Social History:   Social History   Socioeconomic History   Marital status: Married    Spouse name: Not on file   Number of children: Not on file   Years of education: Not on file   Highest education level: Not on file  Occupational History   Not on file  Tobacco Use   Smoking status: Never   Smokeless tobacco: Never  Substance and Sexual Activity   Alcohol use: No   Drug use: No   Sexual activity: Yes    Birth control/protection: None  Other Topics Concern   Not on file  Social History Narrative   Not on file   Social Determinants of Health  Financial Resource Strain: Not on file  Food Insecurity: Not on file  Transportation Needs: Not on file  Physical Activity: Not on file  Stress: Not on file  Social Connections: Not on file  Intimate Partner Violence: Not on file    Family History:   The patient's family history includes CAD in his father; Colon cancer in his paternal aunt.    ROS:  Please see the history of present illness.  All other ROS reviewed and negative.     Physical Exam/Data:   Vitals:   03/15/21 0700 03/15/21 0730 03/15/21 0800 03/15/21 0830  BP: 117/65 122/74 129/73 127/71  Pulse: (!) 55 (!) 53 (!) 54 (!) 57  Resp: (!) 22 15 17 19   Temp:      SpO2: 99%  96% 98% 98%  Weight:      Height:        Intake/Output Summary (Last 24 hours) at 03/15/2021 0900 Last data filed at 03/15/2021 03/17/2021 Gross per 24 hour  Intake 1000 ml  Output --  Net 1000 ml   Last 3 Weights 03/15/2021 12/24/2012 02/02/2012  Weight (lbs) 164 lb 14.5 oz 165 lb 160 lb  Weight (kg) 74.8 kg 74.844 kg 72.576 kg     Body mass index is 24.35 kg/m.  General:  Well nourished, well developed male appearing in no acute distress. HEENT: normal Lymph: no adenopathy Neck: no JVD Endocrine:  No thryomegaly Vascular: No carotid bruits; FA pulses 2+ bilaterally without bruits  Cardiac:  normal S1, S2; RRR; no murmur  Lungs:  clear to auscultation bilaterally, no wheezing, rhonchi or rales  Abd: soft, nontender, no hepatomegaly  Ext: no pitting edema Musculoskeletal:  No deformities, BUE and BLE strength normal and equal Skin: warm and dry  Neuro:  CNs 2-12 intact, no focal abnormalities noted Psych:  Normal affect    EKG:  The ECG that was done was personally reviewed and demonstrates sinus bradycardia, HR 50 with no distinct ST abnormalities.  Relevant CV Studies:  None on File.   Laboratory Data:  High Sensitivity Troponin:   Recent Labs  Lab 03/15/21 0309 03/15/21 0432  TROPONINIHS 9 61*      Chemistry Recent Labs  Lab 03/15/21 0309  NA 139  K 3.9  CL 105  CO2 25  GLUCOSE 113*  BUN 26*  CREATININE 1.73*  CALCIUM 9.2  GFRNONAA 42*  ANIONGAP 9    Recent Labs  Lab 03/15/21 0309  PROT 7.5  ALBUMIN 4.4  AST 22  ALT 28  ALKPHOS 51  BILITOT 1.2   Hematology Recent Labs  Lab 03/15/21 0309  WBC 6.6  RBC 4.82  HGB 14.9  HCT 44.5  MCV 92.3  MCH 30.9  MCHC 33.5  RDW 14.0  PLT 188   BNPNo results for input(s): BNP, PROBNP in the last 168 hours.  DDimer No results for input(s): DDIMER in the last 168 hours.   Radiology/Studies:  DG Chest Portable 1 View  Result Date: 03/15/2021 CLINICAL DATA:  Chest pain. EXAM: PORTABLE CHEST 1 VIEW  COMPARISON:  None. FINDINGS: The heart size and mediastinal contours are within normal limits. Both lungs are clear. The visualized skeletal structures are unremarkable. IMPRESSION: No active disease. Electronically Signed   By: 03/17/2021 M.D.   On: 03/15/2021 03:04     Assessment and Plan:   1. Chest Pain/Elevated Troponin Values - He presents with chest pain which awoke him from sleep and resolved with nitroglycerin while in the ED. He  is starting to develop recurrent pain at this time and is currently being started on a NTG drip. Already received ASA 324mg  daily and is on IV Heparin.  - Initial Hs Troponin 9 with repeat of 61. Will continue to trend. Will check FLP and Hgb A1c for risk stratification. Only known risk factor at this time is his family history of CAD.  - He was accepted in transfer to [facility] by the overnight fellow with likely plans for a cardiac catheterization today but no beds are currently available. Reviewed a cardiac catheterization with the patient and his wife and he is in agreement to proceed. The patient understands that risks include but are not limited to stroke (1 in 1000), death (1 in 1000), kidney failure [usually temporary] (1 in 500), bleeding (1 in 200), allergic reaction [possibly serious] (1 in 200).  Will await Dr[facility] to see the patient as well. Tentatively placed on the cath board for this afternoon.  - Continue Heparin and IV NTG. Start ASA 81mg  daily and Crestor 40mg  daily. No BB given baseline bradycardia.   2. AKI - Unknown baseline but he reports being told in the past he had an "abnormal creatinine" which normalized. He does work outside on a daily basis and could be dehydrated. He has received 1L IVF and repeat BMET is pending.    Risk Assessment/Risk Scores:    TIMI Risk Score for Unstable Angina or Non-ST Elevation MI:   The patient's TIMI risk score is 3, which indicates a 13% risk of all cause mortality, new or recurrent myocardial  infarction or need for urgent revascularization in the next 14 days.  For questions or updates, please contact [facility] Please consult [email or web address] for contact info under     Signed, Wyline Mood, PA-C  03/15/2021 9:00 AM   Patient seen and discussed with PA , I agree with her documentaiton. 70 yo male no past cardiac history presents with chest pain. Sudden onset this AM that awoke him from sleep. Better with NG in the ER.    WBC 6.6 Hgb 14.9 Plt 188 K 3.9 Cr 1.73 BUN 26 Trop 9-->61 COVID neg CXR no acute process EKG SR, no acute ischemic changes     Patient presents with chest pain. EKG without acute findings, trop neg on admit with subsequent + delta though absolute numbers remain mild. Pain better with NG in ER first subligual, recurrent pain that resolved on low dose NG drip. Also isolated 7 beat run of NSVT. Given the presentation the pretest probability of ischemic heart disease and NSTEMI is high enough to warrant invasive testing, agree with plans for cath today. Medical therapy with hep gtt, ASA, crestor 40, nitro gtt. No ACE/ARB given Cr, low HRs at times no beta blocker. Obtain echo. Plan for transfer today to [facility] for heart catheterization.  Isolated 7 beat run of NSVT, K was 3.9 on admit Mg pending.   Some renal dysfunction on labs unknown baseline, has received a 1L NS bolus this AM with precath fluids ordered. F/u repeat labs, Cr not prohibitive for cath.    Iran Ouch MD

## 2021-03-15 NOTE — Progress Notes (Signed)
*  PRELIMINARY RESULTS* Echocardiogram 2D Echocardiogram has been performed.  Stacey Drain 03/15/2021, 10:49 AM

## 2021-03-15 NOTE — Progress Notes (Signed)
ANTICOAGULATION CONSULT NOTE - Initial Consult  Pharmacy Consult for heparin Indication: chest pain/ACS  No Known Allergies  Patient Measurements: Height: 5\' 9"  (175.3 cm) Weight: 74.8 kg (164 lb 14.5 oz) IBW/kg (Calculated) : 70.7 Heparin Dosing Weight:  74.8 kg  Vital Signs: Temp: 98 F (36.7 C) (08/24 0155) BP: 118/76 (08/24 0500) Pulse Rate: 48 (08/24 0500)  Labs: Recent Labs    03/15/21 0309 03/15/21 0432  HGB 14.9  --   HCT 44.5  --   PLT 188  --   CREATININE 1.73*  --   TROPONINIHS 9 61*    Estimated Creatinine Clearance: 40.3 mL/min (A) (by C-G formula based on SCr of 1.73 mg/dL (H)).   Medical History: Past Medical History:  Diagnosis Date   Medical history non-contributory     Medications:  See medication history  Assessment: 70 yo man to start heparin for CP.  He was not on anticoagulation PTA.  Hg 14.9, PTLC 188 Goal of Therapy:  Heparin level 0.3-0.7 units/ml Monitor platelets by anticoagulation protocol: Yes   Plan:  Heparin bolus 3500 unit and drip at 1000 units/hr Check heparin level in 6-8 hours Daily HL and CBC while on heparin Monitor for bleeding complications  Robyne Matar Poteet 03/15/2021,6:20 AM

## 2021-03-15 NOTE — ED Triage Notes (Signed)
Pt c/o substernal, chest pain that woke him from sleep.

## 2021-03-15 NOTE — Progress Notes (Signed)
Pt arrived by medic into [place] [facility], cath lab holding, chest shaved and re-connected to heart monitor, IV heparin noted at 1000u/hour (discontinued for procedure at 1324), IV NTG infusing at 63mcg/kg/min, no c/o pain, safety maintained

## 2021-03-15 NOTE — ED Provider Notes (Signed)
[facility] EMERGENCY DEPARTMENT Provider Note   CSN: [id number] Arrival date & time: 03/15/21  0151     History Chief Complaint  Patient presents with   Chest Pain    Philip Daniels is a 70 y.o. male.  Patient with no chronic medical conditions woke up from sleep with central chest tightness about 1 AM that has been constant.  Describes tightness as well as pressure in the center of his chest without radiation.  This has been constant since 1 AM and gradually improving.  Was a 4-10 initially now is 2-10.  Did not take any for it at home.  No associated shortness of breath.  It is not exertional or pleuritic.  No nausea, vomiting, diaphoresis, cough or fever.  Has never had this kind of pain before.  Denies any cardiac history.  Has never had a stress test.  Denies any history of ulcers or acid reflux. States [place] with no chest pain normally. States that the pain is constant and gradually improving.  There is no radiation.  There is no shortness of breath, cough, fever, diaphoresis or vomiting.  The history is provided by the patient.  Chest Pain Associated symptoms: no abdominal pain, no dizziness, no fever, no headache, no nausea, no shortness of breath, no vomiting and no weakness       Past Medical History:  Diagnosis Date   Medical history non-contributory     There are no problems to display for this patient.   Past Surgical History:  Procedure Laterality Date   COLONOSCOPY N/A 12/24/2012   Procedure: COLONOSCOPY;  Surgeon: Malissa Hippo, MD;  Location: [facility];  Service: Endoscopy;  Laterality: N/A;  730   TONSILLECTOMY  1970       Family History  Problem Relation Age of Onset   Colon cancer Paternal Aunt     Social History   Tobacco Use   Smoking status: Never   Smokeless tobacco: Never  Substance Use Topics   Alcohol use: No   Drug use: No    Home Medications Prior to Admission medications   Medication Sig Start  Date End Date Taking? Authorizing Provider  clobetasol cream (TEMOVATE) 0.05 % Apply 1 application topically 2 (two) times daily.    [provider]  Multiple Vitamin (MULTIVITAMIN WITH MINERALS) TABS Take 1 tablet by mouth daily.    [provider]  vitamin C (ASCORBIC ACID) 500 MG tablet Take 500 mg by mouth daily.    [provider]    Allergies    Patient has no known allergies.  Review of Systems   Review of Systems  Constitutional:  Negative for activity change, appetite change and fever.  HENT:  Negative for congestion.   Respiratory:  Positive for chest tightness. Negative for shortness of breath.   Cardiovascular:  Positive for chest pain.  Gastrointestinal:  Negative for abdominal pain, nausea and vomiting.  Genitourinary:  Negative for dysuria and hematuria.  Musculoskeletal:  Negative for arthralgias and myalgias.  Skin:  Negative for rash.  Neurological:  Negative for dizziness, weakness and headaches.   all other systems are negative except as noted in the HPI and PMH.   Physical Exam Updated Vital Signs BP (!) 151/78   Pulse (!) 54   Temp 98 F (36.7 C)   Resp 16   Ht 5\' 9"  (1.753 m)   Wt 74.8 kg   SpO2 100%   BMI 24.35 kg/m  Physical Exam Vitals and nursing note reviewed.  Constitutional:      General: He is not in acute distress.    Appearance: He is well-developed.     Comments: Comfortable appearing  HENT:     Head: Normocephalic and atraumatic.     Mouth/Throat:     Pharynx: No oropharyngeal exudate.  Eyes:     Conjunctiva/sclera: Conjunctivae normal.     Pupils: Pupils are equal, round, and reactive to light.  Neck:     Comments: No meningismus. Cardiovascular:     Rate and Rhythm: Normal rate and regular rhythm.     Heart sounds: Normal heart sounds. No murmur heard. Pulmonary:     Effort: Pulmonary effort is normal. No respiratory distress.     Breath sounds: Normal breath sounds.  Abdominal:     Palpations:  Abdomen is soft.     Tenderness: There is no abdominal tenderness. There is no guarding or rebound.  Musculoskeletal:        General: No tenderness. Normal range of motion.     Cervical back: Normal range of motion and neck supple.  Skin:    General: Skin is warm.  Neurological:     Mental Status: He is alert and oriented to person, place, and time.     Cranial Nerves: No cranial nerve deficit.     Motor: No abnormal muscle tone.     Coordination: Coordination normal.     Comments: No ataxia on finger to nose bilaterally. No pronator drift. 5/5 strength throughout. CN 2-12 intact.Equal grip strength. Sensation intact.   Psychiatric:        Behavior: Behavior normal.    ED Results / Procedures / Treatments   Labs (all labs ordered are listed, but only abnormal results are displayed) Labs Reviewed  COMPREHENSIVE METABOLIC PANEL - Abnormal; Notable for the following components:      Result Value   Glucose, Bld 113 (*)    BUN 26 (*)    Creatinine, Ser 1.73 (*)    GFR, Estimated 42 (*)    All other components within normal limits  TROPONIN I (HIGH SENSITIVITY) - Abnormal; Notable for the following components:   Troponin I (High Sensitivity) 61 (*)    All other components within normal limits  RESP PANEL BY RT-PCR (FLU A&B, COVID) ARPGX2  CBC WITH DIFFERENTIAL/PLATELET  LIPASE, BLOOD  TROPONIN I (HIGH SENSITIVITY)    EKG EKG Interpretation  Date/Time:  Wednesday March 15 2021 02:04:24 EDT Ventricular Rate:  59 PR Interval:  176 QRS Duration: 74 QT Interval:  392 QTC Calculation: 388 R Axis:   28 Text Interpretation: Sinus bradycardia Otherwise normal ECG No previous ECGs available Artifact Confirmed by Glynn Octave [phone]) on 03/15/2021 2:19:22 AM  Radiology DG Chest Portable 1 View  Result Date: 03/15/2021 CLINICAL DATA:  Chest pain. EXAM: PORTABLE CHEST 1 VIEW COMPARISON:  None. FINDINGS: The heart size and mediastinal contours are within normal limits. Both lungs  are clear. The visualized skeletal structures are unremarkable. IMPRESSION: No active disease. Electronically Signed   By: Elgie Collard M.D.   On: 03/15/2021 03:04    Procedures .Critical Care  Date/Time: 03/15/2021 6:11 AM Performed by: Glynn Octave, MD Authorized by: Glynn Octave, MD   Critical care provider statement:    Critical care time (minutes):  45   Critical care was necessary to treat or prevent imminent or life-threatening deterioration of the following conditions: acute coronary syndrome, NSTEMI.   Critical care was time spent personally  by me on the following activities:  Discussions with consultants, evaluation of patient's response to treatment, examination of patient, ordering and performing treatments and interventions, ordering and review of laboratory studies, ordering and review of radiographic studies, pulse oximetry, re-evaluation of patient's condition, obtaining history from patient or surrogate and review of old charts   Medications Ordered in ED Medications  aspirin chewable tablet 324 mg (has no administration in time range)  nitroGLYCERIN (NITROSTAT) SL tablet 0.4 mg (has no administration in time range)    ED Course  I have reviewed the triage vital signs and the nursing notes.  Pertinent labs & imaging results that were available during my care of the patient were reviewed by me and considered in my medical decision making (see chart for details).    MDM Rules/Calculators/A&P                          Central chest tightness and pressure that woke him from sleep about 1 AM.  EKG is normal sinus rhythm without any acute ST changes.  Aspirin and nitroglycerin given.  Labs obtained.  Chest x-ray obtained.  Heart score is 2. First troponin is negative  After 2 nitroglycerin, chest tightness and pressure has resolved.  Chest x-ray is negative. Low suspicion for ACS or pulmonary embolism.  Repeat troponin is 61.  Patient remains chest  pain-free.  EKG is unchanged.  Discussed with cardiology Dr. Rosita Fire at [facility] who agrees patient likely needs catheterization and accepts patient to service of Dr. Mayford Knife.  Patient found to have creatinine 1.7 without any previous values.  Will hydrate gently.  Patient states he was told by his PCP that he had renal failure in the past but believes it improved.  Initiate IV heparin and IV nitroglycerin.  Remains chest pain-free. Patient and wife updated at bedside Final Clinical Impression(s) / ED Diagnoses Final diagnoses:  NSTEMI (non-ST elevated myocardial infarction) [facility])    Rx / DC Orders ED Discharge Orders     None        Din Bookwalter, Jeannett Senior, MD 03/15/21 [phone]

## 2021-03-16 ENCOUNTER — Encounter (HOSPITAL_COMMUNITY): Payer: Self-pay | Admitting: Cardiovascular Disease

## 2021-03-16 ENCOUNTER — Other Ambulatory Visit (HOSPITAL_COMMUNITY): Payer: Self-pay

## 2021-03-16 DIAGNOSIS — I214 Non-ST elevation (NSTEMI) myocardial infarction: Secondary | ICD-10-CM | POA: Diagnosis not present

## 2021-03-16 LAB — BASIC METABOLIC PANEL
Anion gap: 8 (ref 5–15)
BUN: 18 mg/dL (ref 8–23)
CO2: 22 mmol/L (ref 22–32)
Calcium: 8.6 mg/dL — ABNORMAL LOW (ref 8.9–10.3)
Chloride: 106 mmol/L (ref 98–111)
Creatinine, Ser: 1.43 mg/dL — ABNORMAL HIGH (ref 0.61–1.24)
GFR, Estimated: 53 mL/min — ABNORMAL LOW (ref >60)
Glucose, Bld: 106 mg/dL — ABNORMAL HIGH (ref 70–99)
Potassium: 3.7 mmol/L (ref 3.5–5.1)
Sodium: 136 mmol/L (ref 135–145)

## 2021-03-16 LAB — CBC
HCT: 39.3 % (ref 39.0–52.0)
Hemoglobin: 13.1 g/dL (ref 13.0–17.0)
MCH: 30.3 pg (ref 26.0–34.0)
MCHC: 33.3 g/dL (ref 30.0–36.0)
MCV: 90.8 fL (ref 80.0–100.0)
Platelets: 178 10*3/uL (ref 150–400)
RBC: 4.33 MIL/uL (ref 4.22–5.81)
RDW: 14.2 % (ref 11.5–15.5)
WBC: 6.9 10*3/uL (ref 4.0–10.5)
nRBC: 0 % (ref 0.0–0.2)

## 2021-03-16 MED ORDER — ROSUVASTATIN CALCIUM 40 MG PO TABS
40.0000 mg | ORAL_TABLET | Freq: Every day | ORAL | 11 refills | Status: DC
  Filled 2021-03-16: qty 30, 30d supply, fill #0

## 2021-03-16 MED ORDER — ASPIRIN 81 MG PO CHEW: 81.0000 mg | CHEWABLE_TABLET | Freq: Every day | ORAL | Status: AC

## 2021-03-16 MED ORDER — TICAGRELOR 90 MG PO TABS
90.0000 mg | ORAL_TABLET | Freq: Two times a day (BID) | ORAL | 11 refills | Status: DC
  Filled 2021-03-16: qty 60, 30d supply, fill #0

## 2021-03-16 MED ORDER — NITROGLYCERIN 0.4 MG SL SUBL
0.4000 mg | SUBLINGUAL_TABLET | SUBLINGUAL | 12 refills | Status: DC | PRN
  Filled 2021-03-16: qty 25, 7d supply, fill #0

## 2021-03-16 MED FILL — Nitroglycerin IV Soln 100 MCG/ML in D5W: INTRA_ARTERIAL | Qty: 10 | Status: AC

## 2021-03-16 NOTE — Progress Notes (Signed)
CARDIAC REHAB PHASE I   PRE:  Rate/Rhythm: 53 SB  BP:  Supine: 127/82  Sitting:   Standing:    SaO2: 99%RA  MODE:  Ambulation: 550 ft   POST:  Rate/Rhythm: 73 SR  BP:  Supine:   Sitting: 145/86  Standing:    SaO2: 96%RA 0750-0840 Pt walked 550 ft on RA with steady gait and tolerated well. No CP. MI education completed with pt who voiced understanding. Stressed importance of brilinta with stent. Reviewed MI restrictions, NTG use, walking for ex, heart healthy food choices, and CRP 2. Referred to [place] CRP 2.    Luetta Nutting, RN BSN  03/16/2021 8:37 AM

## 2021-03-16 NOTE — Discharge Summary (Addendum)
Discharge Summary    Patient ID: Philip Daniels MRN: [id number]; DOB: March 08, 1951  Admit date: 03/15/2021 Discharge date: 03/16/2021  PCP:  Carylon Perches, MD   [facility] Providers Cardiologist:  Dina Rich, MD        Discharge Diagnoses    Active Problems:   NSTEMI (non-ST elevated myocardial infarction) [facility])   Diagnostic Studies/Procedures    CARDIAC CATH: 03/15/2021   Prox RCA lesion is 40% stenosed.   Mid LAD lesion is 30% stenosed.   Ost LM lesion is 30% stenosed.   1st Mrg lesion is 100% stenosed.   A drug-eluting stent was successfully placed using a SYNERGY XD 2.25X24.   Post intervention, there is a 0% residual stenosis.   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 Start high intensity statin. No beta blocker with bradycardia. Echo later today to assess LVEF. BMET in am. Gently hydration post cath.  Intervention    _____________   History of Present Illness     Philip Daniels is a 70 y.o. male with past medical history of known renal insufficiency and family history of CAD who was admitted 08/24 for the evaluation of an chest pain and elevated troponin values.  Mr. Birks presented to [facility] ED during the early morning hours of 03/15/2021 for evaluation of chest pain which awoke him from sleep. 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 He was in his normal state of health until early this morning when he developed chest tightness which awoke him from sleep. He denies any associated radiating pain, nausea, vomiting or diaphoresis. The tightness persisted which prompted  him to come to the ED. He was given SL NTG x2 with resolution of his symptoms. He does report recurrent tightness is starting to develop at this time and has a NTG drip ordered.   Hospital Course     Consultants: None  NSTEMI - Cardiac enzymes are below.  Because of his concerning symptoms as well as the elevation in his cardiac enzymes, he was taken to the Cath Lab.  Results are above.  He had significant stenosis in the OM1, treated with DES.  Residual nonobstructive disease is to be managed medically.  He tolerated the procedure well.  On 8/25, he was seen by Dr. Izora Ribas and all data were reviewed.  Of note, he has resting bradycardia and so beta-blocker was not added.  He is on high-dose statin.  He is tolerating aspirin and Brilinta.   He was seen by cardiac rehab and ambulated with them without chest pain or shortness of breath.  His cath site was without ecchymosis or hematoma.    No further inpatient work-up is indicated and he is considered stable for discharge, to follow-up as an outpatient in [facility]   Did the patient have an acute coronary syndrome (MI, NSTEMI, STEMI, etc) this admission?:  Yes                               AHA/ACC Clinical Performance & Quality Measures: Aspirin prescribed? - Yes ADP Receptor Inhibitor (Plavix/Clopidogrel, Brilinta/Ticagrelor or Effient/Prasugrel)  prescribed (includes medically managed patients)? - Yes Beta Blocker prescribed? - No - resting bradycardia High Intensity Statin (Lipitor 40-80mg  or Crestor 20-40mg ) prescribed? - Yes EF assessed during THIS hospitalization? - Yes For EF <40%, was ACEI/ARB prescribed? - Not Applicable (EF >/= 40%) For EF <40%, Aldosterone Antagonist (Spironolactone or Eplerenone) prescribed? - Not Applicable (EF >/= 40%) Cardiac Rehab Phase II ordered (including medically managed patients)? - Yes      _____________  Discharge Vitals Blood pressure 119/80, pulse (!) 50, temperature (!) 97.5 F  (36.4 C), temperature source Oral, resp. rate 19, height 5\' 9"  (1.753 m), weight 81.4 kg, SpO2 96 %.  Filed Weights   03/15/21 0155 03/16/21 0500  Weight: 74.8 kg 81.4 kg   General: Well developed, well nourished, male in no acute distress Head: Eyes PERRLA, Head normocephalic and atraumatic Lungs: Clear bilaterally to auscultation. Heart: HRRR S1 S2, without rub or gallop. No murmur. 4/4 extremity pulses are 2+ & equal. No JVD. Abdomen: Bowel sounds are present, abdomen soft and non-tender without masses or  hernias noted. Msk: Normal strength and tone for age. Extremities: No clubbing, cyanosis or edema.  Radial cath site without ecchymosis or hematoma Skin:  No rashes or lesions noted. Neuro: Alert and oriented X 3. Psych:  Good affect, responds appropriately   Labs & Radiologic Studies    CBC Recent Labs    03/15/21 0309 03/16/21 0251  WBC 6.6 6.9  NEUTROABS 4.4  --   HGB 14.9 13.1  HCT 44.5 39.3  MCV 92.3 90.8  PLT 188 178   Basic Metabolic Panel Recent Labs    96/10/5406/24/22 0830 03/15/21 0912 03/16/21 0251  NA 136  --  136  K 4.0  --  3.7  CL 104  --  106  CO2 22  --  22  GLUCOSE 111*  --  106*  BUN 24*  --  18  CREATININE 1.47*  --  1.43*  CALCIUM 8.6*  --  8.6*  MG  --  2.0  --    Liver Function Tests Recent Labs    03/15/21 0309  AST 22  ALT 28  ALKPHOS 51  BILITOT 1.2  PROT 7.5  ALBUMIN 4.4   Recent Labs    03/15/21 0309  LIPASE 38   High Sensitivity Troponin:   Recent Labs  Lab 03/15/21 0309 03/15/21 0432 03/15/21 0830  TROPONINIHS 9 61* 1,165*    BNP Invalid input(s): POCBNP D-Dimer No results for input(s): DDIMER in the last 72 hours. Hemoglobin A1C Recent Labs    03/15/21 0830  HGBA1C 6.0*   Fasting Lipid Panel Recent Labs    03/15/21 0830  CHOL 168  HDL 33*  LDLCALC 107*  TRIG 141  CHOLHDL 5.1   Thyroid Function Tests No results for input(s): TSH, T4TOTAL, T3FREE, THYROIDAB in the last 72 hours.  Invalid  input(s): FREET3 _____________  CARDIAC CATHETERIZATION  Result Date: 03/15/2021   Prox RCA lesion is 40% stenosed.   Mid LAD lesion is 30% stenosed.   Ost LM lesion is 30% stenosed.   1st Mrg lesion is 100% stenosed.   A drug-eluting stent was successfully placed using a SYNERGY XD 2.25X24.   Post intervention, there is a 0% residual stenosis. Mild ostial left main stenosis Mild heavily calcified mid LAD stenosis Thrombotic occlusion of the first obtuse marginal branch Successful PTCA/DES x 1 obtuse marginal branch Mild to moderate non-obstructive proximal RCA stenosis Recommendations: Will continue DAPT with ASA and Brilinta for one year.  Start high intensity statin. No beta blocker with bradycardia. Echo later today to assess LVEF. BMET in am. Gently hydration post cath.   DG Chest Portable 1 View  Result Date: 03/15/2021 CLINICAL DATA:  Chest pain. EXAM: PORTABLE CHEST 1 VIEW COMPARISON:  None. FINDINGS: The heart size and mediastinal contours are within normal limits. Both lungs are clear. The visualized skeletal structures are unremarkable. IMPRESSION: No active disease. Electronically Signed   By: Elgie Collard M.D.   On: 03/15/2021 03:04   ECHOCARDIOGRAM COMPLETE  Result Date: 03/15/2021    ECHOCARDIOGRAM REPORT   Patient Name:   DELMORE SEAR [facility] Date of Exam: 03/15/2021 Medical Rec #:  [id number]        Height:       69.0 in Accession #:    [id number]       Weight:       164.9 lb Date of Birth:  1951/06/26        BSA:          1.903 m Patient Age:    70 years         BP:           117/72 mmHg Patient Gender: M                HR:           54 bpm. [facility] Procedure: 2D Echo, Cardiac Doppler and Color Doppler Indications:    Chest Pain R07.9  History:        Patient has no prior history of Echocardiogram examinations.                 NSTEMI (non-ST elevated myocardial infarction) this visit.                 Medical history non-contributory (From Hx).  Sonographer:    Celesta Gentile RCS Referring Phys: [id number] Ellsworth Lennox IMPRESSIONS  1. Left ventricular ejection fraction, by estimation, is 60 to 65%. The left ventricle has normal function. The left ventricle has no regional wall motion abnormalities. Left ventricular diastolic parameters were normal.  2. Right ventricular systolic function is normal. The right ventricular size is normal. There is normal pulmonary artery systolic pressure.  3. The mitral valve is normal in structure. Trivial mitral valve regurgitation. No evidence of mitral stenosis.  4. The aortic valve is tricuspid. There is moderate calcification of the aortic valve. There is moderate thickening of the aortic valve. Aortic valve regurgitation is mild. No aortic stenosis is present.  5. The inferior vena cava is normal in size with greater than 50% respiratory variability, suggesting right atrial pressure of 3 mmHg. FINDINGS  Left Ventricle: Left ventricular ejection fraction, by estimation, is 60 to 65%. The left ventricle has normal function. The left ventricle has no regional wall motion abnormalities. The left ventricular internal cavity size was normal in size. There is  no left ventricular hypertrophy. Left ventricular diastolic parameters were normal. Right Ventricle: The right ventricular size is normal. No increase in right ventricular wall thickness. Right ventricular systolic function is normal. There is normal pulmonary artery systolic pressure. The tricuspid regurgitant velocity is 2.25 m/s, and  with an assumed right atrial pressure of 3 mmHg, the estimated right ventricular systolic pressure is 23.2 mmHg. Left Atrium: Left atrial size was normal in size. Right Atrium: Right atrial size was normal in size. Pericardium: There is no evidence of pericardial effusion. Mitral Valve: The mitral valve is  normal in structure. Trivial mitral valve regurgitation. No evidence of mitral valve stenosis. Tricuspid Valve: The tricuspid valve is normal in  structure. Tricuspid valve regurgitation is trivial. No evidence of tricuspid stenosis. Aortic Valve: The aortic valve is tricuspid. There is moderate calcification of the aortic valve. There is moderate thickening of the aortic valve. There is mild aortic valve annular calcification. Aortic valve regurgitation is mild. Aortic regurgitation  PHT measures 783 msec. No aortic stenosis is present. Aortic valve mean gradient measures 7.0 mmHg. Aortic valve peak gradient measures 11.7 mmHg. Aortic valve area, by VTI measures 2.92 cm. Pulmonic Valve: The pulmonic valve was not well visualized. Pulmonic valve regurgitation is trivial. No evidence of pulmonic stenosis. Aorta: The aortic root is normal in size and structure. Venous: The inferior vena cava is normal in size with greater than 50% respiratory variability, suggesting right atrial pressure of 3 mmHg. IAS/Shunts: No atrial level shunt detected by color flow Doppler.  LEFT VENTRICLE PLAX 2D LVIDd:         5.10 cm  Diastology LVIDs:         3.00 cm  LV e' medial:    7.51 cm/s LV PW:         0.80 cm  LV E/e' medial:  9.9 LV IVS:        0.90 cm  LV e' lateral:   12.20 cm/s LVOT diam:     2.20 cm  LV E/e' lateral: 6.1 LV SV:         113 LV SV Index:   59 LVOT Area:     3.80 cm  RIGHT VENTRICLE RV S prime:     12.30 cm/s TAPSE (M-mode): 2.0 cm LEFT ATRIUM             Index       RIGHT ATRIUM           Index LA diam:        3.60 cm 1.89 cm/m  RA Area:     17.50 cm LA Vol (A2C):   36.7 ml 19.28 ml/m RA Volume:   49.70 ml  26.11 ml/m LA Vol (A4C):   58.8 ml 30.89 ml/m LA Biplane Vol: 47.2 ml 24.80 ml/m  AORTIC VALVE AV Area (Vmax):    2.60 cm AV Area (Vmean):   2.11 cm AV Area (VTI):     2.92 cm AV Vmax:           171.03 cm/s AV Vmean:          125.955 cm/s AV VTI:            0.386 m AV Peak Grad:      11.7 mmHg AV Mean Grad:      7.0 mmHg LVOT Vmax:         117.00 cm/s LVOT Vmean:        69.900 cm/s LVOT VTI:          0.296 m LVOT/AV VTI ratio: 0.77 AI PHT:             783 msec  AORTA Ao Root diam: 3.70 cm MITRAL VALVE               TRICUSPID VALVE MV Area (PHT): 4.68 cm    TR Peak grad:   20.2 mmHg MV Decel Time: 162 msec    TR Vmax:        225.00 cm/s MV E velocity: 74.40 cm/s MV A velocity: 52.50 cm/s  SHUNTS MV E/A  ratio:  1.42        Systemic VTI:  0.30 m                            Systemic Diam: 2.20 cm Dina Rich MD Electronically signed by Dina Rich MD Signature Date/Time: 03/15/2021/12:13:06 PM    Final    Disposition   Pt is being discharged home today in good condition.  Follow-up Plans & Appointments     Follow-up Information     Antoine Poche, MD Follow up on 03/20/2021.   Specialty: Cardiology Why: See Dr. Wyline Mood in the [place] office at 14:40 p.m.  Please arrive 15 minutes early for paperwork.  After that, you can follow-up in the [place] office. Contact information: [address] [place] [address] [phone]                Discharge Instructions     Amb Referral to Cardiac Rehabilitation   Complete by: As directed    Diagnosis:  Coronary Stents NSTEMI     After initial evaluation and assessments completed: Virtual Based Care may be provided alone or in conjunction with Phase 2 Cardiac Rehab based on patient barriers.: Yes   Call MD for:  redness, tenderness, or signs of infection (pain, swelling, redness, odor or green/yellow discharge around incision site)   Complete by: As directed    Diet - low sodium heart healthy   Complete by: As directed    Increase activity slowly   Complete by: As directed        Discharge Medications   Allergies as of 03/16/2021   No Known Allergies      Medication List     TAKE these medications    aspirin 81 MG chewable tablet Chew 1 tablet (81 mg total) by mouth daily. Start taking on: March 17, 2021   multivitamin with minerals Tabs tablet Take 1 tablet by mouth daily.   nitroGLYCERIN 0.4 MG SL tablet Commonly known as:  NITROSTAT Place 1 tablet (0.4 mg total) under the tongue every 5 (five) minutes x 3 doses as needed for chest pain.   rosuvastatin 40 MG tablet Commonly known as: CRESTOR Take 1 tablet (40 mg total) by mouth daily at 6 PM.   ticagrelor 90 MG Tabs tablet Commonly known as: BRILINTA Take 1 tablet (90 mg total) by mouth 2 (two) times daily.   vitamin C 500 MG tablet Commonly known as: ASCORBIC ACID Take 500 mg by mouth daily.           Outstanding Labs/Studies   None  Duration of Discharge Encounter   Greater than 30 minutes including physician time.  Signed, Theodore Demark, PA-C 03/16/2021, 11:21 AM  Personally seen and examined. Agree with APP above with the following comments: Briefly 70 yo M with hx of CKD Stage IIIa who presented with NSTEMI. Patient notes no further CP, chest pressure, tightness.  No SOB.  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  No arm or hand pain (R rad access).  Exam notable for RRR, No SOB, no DOE.  R radial without hematoma or bruit, distal cap refill good. Labs notable for stable to improvec creatinine 1.47->1.43, slight decrease in Hgb from pre-cath. Personally reviewed relevant tests; echo with no evidence of LV dysfunction Would recommend  - DAPT, with potential transition from ticagrelor to plavix at one month - high intensity statin -  BB held for bradycardia rates 60s on exam but 50s on telemetry (nocturnal) - has PRN nitro  - has scheduled f/u  Riley Lam, MD Cardiologist [facility]  [address] [place], [address] [place], [place] [address] [phone]  11:56 AM

## 2021-03-16 NOTE — Care Management (Signed)
03-16-21 5697 Benefits check submitted for Brilinta. Case Manager will follow for cost.

## 2021-03-16 NOTE — Progress Notes (Signed)
AVS reviewed with patient and his wife.  Ensured all belongings in pt's own possession.  Escorted pt to main lobby via wheelchair and into POV.

## 2021-03-16 NOTE — TOC Benefit Eligibility Note (Signed)
Transition of Care [facility]) Benefit Eligibility Note    Patient Details  Name: Philip Daniels MRN: [id number] Date of Birth: 1950/09/13   Medication/Dose: BRILINTA  90 MG BID  Covered?: Yes  Tier: 3 Drug  Prescription Coverage Preferred Pharmacy: [facility],   [facility]  Spoke with Person/Company/Phone Number:: TRISH  @ [employer]  [employer] # [phone] OPT- MEMBER  Co-Pay: $ 100.00  Prior Approval: No  Deductible: Unmet (OUT-OF-POCKET:UNMET)       Mardene Sayer Phone Number: 03/16/2021, 11:31 AM

## 2021-03-16 NOTE — Discharge Instructions (Signed)
PLEASE REMEMBER TO BRING ALL OF YOUR MEDICATIONS TO EACH OF YOUR FOLLOW-UP OFFICE VISITS. ° °PLEASE ATTEND ALL SCHEDULED FOLLOW-UP APPOINTMENTS.  ° °Activity: Increase activity slowly as tolerated. You may shower, but no soaking baths (or swimming) for 1 week. No driving for 1 week. No lifting over 5 lbs for 2 weeks. No sexual activity for 1 week.  ° °You May Return to Work: in 3 weeks (if applicable) ° °Wound Care: You may wash cath site gently with soap and water. Keep cath site clean and dry. If you notice pain, swelling, bleeding or pus at your cath site, please call [phone]. ° ° ° °Cardiac Cath Site Care °Refer to this sheet in the next few weeks. These instructions provide you with information on caring for yourself after your procedure. Your caregiver may also give you more specific instructions. Your treatment has been planned according to current medical practices, but problems sometimes occur. Call your caregiver if you have any problems or questions after your procedure. °HOME CARE INSTRUCTIONS °· You may shower 24 hours after the procedure. Remove the bandage (dressing) and gently wash the site with plain soap and water. Gently pat the site dry.  °· Do not apply powder or lotion to the site.  °· Do not sit in a bathtub, swimming pool, or whirlpool for 5 to 7 days.  °· No bending, squatting, or lifting anything over 10 pounds (4.5 kg) as directed by your caregiver.  °· Inspect the site at least twice daily.  °· Do not drive home if you are discharged the same day of the procedure. Have someone else drive you.  °· You may drive 24 hours after the procedure unless otherwise instructed by your caregiver.  °What to expect: °· Any bruising will usually fade within 1 to 2 weeks.  °· Blood that collects in the tissue (hematoma) may be painful to the touch. It should usually decrease in size and tenderness within 1 to 2 weeks.  °SEEK IMMEDIATE MEDICAL CARE IF: °· You have unusual pain at the site or down the  affected limb.  °· You have redness, warmth, swelling, or pain at the site.  °· You have drainage (other than a small amount of blood on the dressing).  °· You have chills.  °· You have a fever or persistent symptoms for more than 72 hours.  °· You have a fever and your symptoms suddenly get worse.  °· Your leg becomes pale, cool, tingly, or numb.  °· You have heavy bleeding from the site. Hold pressure on the site.  °Document Released: 08/11/2010 Document Revised: 06/28/2011 Document Reviewed:  ° °

## 2021-03-16 NOTE — Progress Notes (Signed)
   03/16/21 1027  Clinical Encounter Type  Visited With Patient and family together  Visit Type Initial  Referral From Nurse  Consult/Referral To Chaplain   Chaplain responded to the consult request for an Advanced Directive. The patient's wife was also at the patient's bedside. The patient stated he does not need the Advance Directive; he said his wife would make medical decisions if he were unable to. This note was prepared by Deneen Harts, M.Div..  For questions please contact by phone [phone].

## 2021-03-20 ENCOUNTER — Ambulatory Visit (INDEPENDENT_AMBULATORY_CARE_PROVIDER_SITE_OTHER): Payer: Medicare HMO | Admitting: Cardiology

## 2021-03-20 ENCOUNTER — Encounter: Payer: Self-pay | Admitting: Cardiology

## 2021-03-20 ENCOUNTER — Other Ambulatory Visit: Payer: Self-pay

## 2021-03-20 VITALS — BP 116/64 | HR 76 | Ht 69.0 in | Wt 177.4 lb

## 2021-03-20 DIAGNOSIS — I2510 Atherosclerotic heart disease of native coronary artery without angina pectoris: Secondary | ICD-10-CM

## 2021-03-20 DIAGNOSIS — E782 Mixed hyperlipidemia: Secondary | ICD-10-CM | POA: Diagnosis not present

## 2021-03-20 NOTE — Patient Instructions (Addendum)
Medication Instructions:  Continue all current medications.  Labwork: none  Testing/Procedures: none  Follow-Up: 4 months   Any Other Special Instructions Will Be Listed Below (If Applicable).  If you need a refill on your cardiac medications before your next appointment, please call your pharmacy.\ 

## 2021-03-20 NOTE — Progress Notes (Signed)
Clinical Summary Mr. Mucha is a 70 y.o.male seen today for follow up of the following medical problems.   1.CAD/NSTEMI - admitted 02/2021 with NSTEMI, trop up to 1165 - 02/2021 cath ostial LM 30%, LAD mid 30%, LCX OM1 100% and thrombotic, RCA prox 40%. Received DES to OM1.  - 02/2021 echo 60-65%, no WMAs  - no recent chest pains, no SOB/DOE   - no beta blocker due to bradycardia. 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 LDL 107, started on atorva 80mg  daily.  - hesitant for additional medications.    SH: works as   Past Medical History:  Diagnosis Date   Medical history non-contributory      No Known Allergies   Current Outpatient Medications  Medication Sig Dispense Refill   aspirin 81 MG chewable tablet Chew 1 tablet (81 mg total) by mouth daily.     Multiple Vitamin (MULTIVITAMIN WITH MINERALS) TABS Take 1 tablet by mouth daily.     nitroGLYCERIN (NITROSTAT) 0.4 MG SL tablet Place 1 tablet (0.4 mg total) under the tongue every 5 (five) minutes x 3 doses as needed for chest pain. 25 tablet 12   rosuvastatin (CRESTOR) 40 MG tablet Take 1 tablet (40 mg total) by mouth daily at 6 PM. 30 tablet 11   ticagrelor (BRILINTA) 90 MG TABS tablet Take 1 tablet (90 mg total) by mouth 2 (two) times daily. 60 tablet 11   vitamin C (ASCORBIC ACID) 500 MG tablet Take 500 mg by mouth daily.     No current facility-administered medications for this visit.     Past Surgical History:  Procedure Laterality Date   COLONOSCOPY N/A 12/24/2012   Procedure: COLONOSCOPY;  Surgeon: 02/23/2013, MD;  Location: [facility] ENDO SUITE;  Service: Endoscopy;  Laterality: N/A;  730   CORONARY STENT INTERVENTION N/A 03/15/2021   Procedure: CORONARY STENT INTERVENTION;  Surgeon: 03/17/2021, MD;  Location: [facility] INVASIVE CV LAB;  Service: Cardiovascular;  Laterality: N/A;   LEFT HEART CATH AND CORONARY ANGIOGRAPHY N/A 03/15/2021   Procedure: LEFT HEART CATH AND CORONARY  ANGIOGRAPHY;  Surgeon: 03/17/2021, MD;  Location: [facility] INVASIVE CV LAB;  Service: Cardiovascular;  Laterality: N/A;   TONSILLECTOMY  1970     No Known Allergies    Family History  Problem Relation Age of Onset   CAD Father    Colon cancer Paternal Aunt      Social History Mr. Fritchman reports that he has never smoked. He has never used smokeless tobacco. Mr. Okray reports no history of alcohol use.   Review of Systems CONSTITUTIONAL: No weight loss, fever, chills, weakness or fatigue.  HEENT: Eyes: No visual loss, blurred vision, double vision or yellow sclerae.No hearing loss, sneezing, congestion, runny nose or sore throat.  SKIN: No rash or itching.  CARDIOVASCULAR: per hpi RESPIRATORY: No shortness of breath, cough or sputum.  GASTROINTESTINAL: No anorexia, nausea, vomiting or diarrhea. No abdominal pain or blood.  GENITOURINARY: No burning on urination, no polyuria NEUROLOGICAL: No headache, dizziness, syncope, paralysis, ataxia, numbness or tingling in the extremities. No change in bowel or bladder control.  MUSCULOSKELETAL: No muscle, back pain, joint pain or stiffness.  LYMPHATICS: No enlarged nodes. No history of splenectomy.  PSYCHIATRIC: No history of depression or anxiety.  ENDOCRINOLOGIC: No reports of sweating, cold or heat intolerance. No polyuria or polydipsia.  Janice Norrie   Physical Examination Today's Vitals   03/20/21 1510  BP: 116/64  Pulse: 76  SpO2: 97%  Weight: 177 lb 6.4 oz (80.5 kg)  Height: 5\' 9"  (1.753 m)   Body mass index is 26.2 kg/m.  Gen: resting comfortably, no acute distress HEENT: no scleral icterus, pupils equal round and reactive, no palptable cervical adenopathy,  CV: RRR, no m/r/g no jvd Resp: Clear to auscultation bilaterally GI: abdomen is soft, non-tender, non-distended, normal bowel sounds, no hepatosplenomegaly MSK: extremities are warm, no edema.  Skin: warm, no rash Neuro:  no focal deficits Psych: appropriate  affect    Assessment and Plan  1.CAD - doing well after recent PCI for NSTEMI in 02/2021 - no symptoms, continue current meds - no beta blocker due to bradycardia. No ACE/ARB due to undelrying renal dysfunction -continue current meds  2. Hyperlipidemia - continue statin, likely repeat panel next visit if not done already by pcp     03/2021, M.D.

## 2021-03-30 ENCOUNTER — Other Ambulatory Visit (HOSPITAL_BASED_OUTPATIENT_CLINIC_OR_DEPARTMENT_OTHER): Payer: Self-pay

## 2021-03-30 ENCOUNTER — Telehealth (HOSPITAL_BASED_OUTPATIENT_CLINIC_OR_DEPARTMENT_OTHER): Payer: Self-pay | Admitting: Pharmacist

## 2021-03-30 ENCOUNTER — Other Ambulatory Visit (HOSPITAL_COMMUNITY): Payer: Self-pay

## 2021-03-30 NOTE — Telephone Encounter (Signed)
Pharmacy Transitions of Care Follow-up Telephone Call  Date of discharge: 03/16/2021   Discharge Diagnosis: NSTEMI  How have you been since you were released from the hospital? Doing well, feeling good overall. 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 We discussed importance of rest while recovering. No signs or symptoms of bleeding.    Medication changes made at discharge:  - START: Brilinta, rosuvastatin and nitroglycerin SL PRN  Medication changes verified by the patient? YES    Medication Accessibility:  Home Pharmacy: [facility]   Was the patient provided with refills on discharged medications? YES   Have all prescriptions been transferred from [facility] to home pharmacy? YES - completed now   Is the patient able to afford medications? Yes   Medication Review: TICAGRELOR (BRILINTA) Ticagrelor 90 mg BID. - Educated patient on potential duration of therapy with Brilinta + 81 mg ASA.  - Discussed importance of taking medication around the same time every day, - Reviewed potential DDIs with patient - Advised patient of medications to avoid (NSAIDs, aspirin maintenance doses>100 mg daily) - Educated that Tylenol (acetaminophen) will be the preferred analgesic to prevent risk of bleeding  - Emphasized importance of monitoring for signs and symptoms of bleeding (abnormal bruising, prolonged bleeding, nose bleeds, bleeding from gums, discolored urine, black tarry stools)  - Educated patient to notify doctor if shortness of breath or abnormal heartbeat occur - Advised patient to alert all providers of antiplatelet therapy prior to starting a new medication or having a procedure    If their condition worsens, is the pt aware to call PCP or go to the Emergency Dept.? Yes  Theodis Sato, PharmD Clinical Pharmacist [facility]  03/30/2021 11:09 AM

## 2021-04-20 ENCOUNTER — Ambulatory Visit: Payer: Medicare HMO | Admitting: Student

## 2021-07-21 ENCOUNTER — Ambulatory Visit: Payer: Medicare HMO | Admitting: Cardiology

## 2021-07-21 ENCOUNTER — Other Ambulatory Visit: Payer: Self-pay

## 2021-07-21 ENCOUNTER — Encounter: Payer: Self-pay | Admitting: Cardiology

## 2021-07-21 VITALS — BP 116/70 | HR 73 | Ht 69.0 in | Wt 176.4 lb

## 2021-07-21 DIAGNOSIS — E782 Mixed hyperlipidemia: Secondary | ICD-10-CM

## 2021-07-21 DIAGNOSIS — I2510 Atherosclerotic heart disease of native coronary artery without angina pectoris: Secondary | ICD-10-CM | POA: Diagnosis not present

## 2021-07-21 NOTE — Progress Notes (Signed)
Clinical Summary Philip Daniels is a 70 y.o.male seen today for follow up of the following medical problems.    1.CAD/NSTEMI - admitted 02/2021 with NSTEMI, trop up to 1165 - 02/2021 cath ostial LM 30%, LAD mid 30%, LCX OM1 100% and thrombotic, RCA prox 40%. Received DES to OM1.  - 02/2021 echo 60-65%, no WMAs   - no recent chest pains, no SOB/DOE     - no beta blocker due to bradycardia. 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 LDL 107, started on atorva 80mg  daily.  - hesitant for additional medications.   - no recent chest pains - compliant with meds - upcoming labs with pcp     SH: works as         Past Medical History:  Diagnosis Date   Medical history non-contributory      No Known Allergies   Current Outpatient Medications  Medication Sig Dispense Refill   aspirin 81 MG chewable tablet Chew 1 tablet (81 mg total) by mouth daily.     Multiple Vitamin (MULTIVITAMIN WITH MINERALS) TABS Take 1 tablet by mouth daily.     nitroGLYCERIN (NITROSTAT) 0.4 MG SL tablet Place 1 tablet (0.4 mg total) under the tongue every 5 (five) minutes x 3 doses as needed for chest pain. 25 tablet 12   rosuvastatin (CRESTOR) 40 MG tablet Take 1 tablet (40 mg total) by mouth daily at 6 PM. 30 tablet 11   ticagrelor (BRILINTA) 90 MG TABS tablet Take 1 tablet (90 mg total) by mouth 2 (two) times daily. 60 tablet 11   vitamin C (ASCORBIC ACID) 500 MG tablet Take 500 mg by mouth daily.     No current facility-administered medications for this visit.     Past Surgical History:  Procedure Laterality Date   COLONOSCOPY N/A 12/24/2012   Procedure: COLONOSCOPY;  Surgeon: 02/23/2013, MD;  [facility];  Service: Endoscopy;  Laterality: N/A;  730   CORONARY STENT INTERVENTION N/A 03/15/2021   Procedure: CORONARY STENT INTERVENTION;  Surgeon: 03/17/2021, MD;  [facility]: [facility];  Service: Cardiovascular;  Laterality: N/A;   LEFT HEART  CATH AND CORONARY ANGIOGRAPHY N/A 03/15/2021   Procedure: LEFT HEART CATH AND CORONARY ANGIOGRAPHY;  Surgeon: 03/17/2021, MD;  Location: [facility];  Service: Cardiovascular;  Laterality: N/A;   TONSILLECTOMY  1970     No Known Allergies    Family History  Problem Relation Age of Onset   CAD Father    Colon cancer Paternal Aunt      Social History Mr. Frady reports that he has never smoked. He has never used smokeless tobacco. Mr. Coia reports no history of alcohol use.   Review of Systems CONSTITUTIONAL: No weight loss, fever, chills, weakness or fatigue.  HEENT: Eyes: No visual loss, blurred vision, double vision or yellow sclerae.No hearing loss, sneezing, congestion, runny nose or sore throat.  SKIN: No rash or itching.  CARDIOVASCULAR: per hpi RESPIRATORY: No shortness of breath, cough or sputum.  GASTROINTESTINAL: No anorexia, nausea, vomiting or diarrhea. No abdominal pain or blood.  GENITOURINARY: No burning on urination, no polyuria NEUROLOGICAL: No headache, dizziness, syncope, paralysis, ataxia, numbness or tingling in the extremities. No change in bowel or bladder control.  MUSCULOSKELETAL: No muscle, back pain, joint pain or stiffness.  LYMPHATICS: No enlarged nodes. No history of splenectomy.  PSYCHIATRIC: No history of depression or anxiety.  ENDOCRINOLOGIC: No reports of sweating,  cold or heat intolerance. No polyuria or polydipsia.  Marland Kitchen   Physical Examination Today's Vitals   07/21/21 1440  BP: 116/70  Pulse: 73  SpO2: 96%  Weight: 176 lb 6.4 oz (80 kg)  Height: 5\' 9"  (1.753 m)   Body mass index is 26.05 kg/m.  Gen: resting comfortably, no acute distress HEENT: no scleral icterus, pupils equal round and reactive, no palptable cervical adenopathy,  CV: RRR, no m/r/g no jvd Resp: Clear to auscultation bilaterally GI: abdomen is soft, non-tender, non-distended, normal bowel sounds, no hepatosplenomegaly MSK: extremities are  warm, no edema.  Skin: warm, no rash Neuro:  no focal deficits Psych: appropriate affect   Diagnostic Studies     Assessment and Plan  1.CAD - doing well after recent PCI for NSTEMI in 02/2021 - no beta blocker due to bradycardia. No ACE/ARB due to undelrying renal dysfunction -no recent symptoms, continue current meds   2. Hyperlipidemia -upcoming labs with pcp, continue crestor   F/u 02/2022,         03/2022, M.D.

## 2021-07-21 NOTE — Patient Instructions (Addendum)
Medication Instructions:  Continue all current medications.  Labwork: none  Testing/Procedures: none  Follow-Up: August 2023  Any Other Special Instructions Will Be Listed Below (If Applicable).   If you need a refill on your cardiac medications before your next appointment, please call your pharmacy.

## 2021-08-09 DIAGNOSIS — E785 Hyperlipidemia, unspecified: Secondary | ICD-10-CM | POA: Diagnosis not present

## 2021-08-09 DIAGNOSIS — R972 Elevated prostate specific antigen [PSA]: Secondary | ICD-10-CM | POA: Diagnosis not present

## 2021-08-09 DIAGNOSIS — N183 Chronic kidney disease, stage 3 unspecified: Secondary | ICD-10-CM | POA: Diagnosis not present

## 2021-08-09 DIAGNOSIS — R001 Bradycardia, unspecified: Secondary | ICD-10-CM | POA: Diagnosis not present

## 2021-08-14 DIAGNOSIS — Z0001 Encounter for general adult medical examination with abnormal findings: Secondary | ICD-10-CM | POA: Diagnosis not present

## 2021-08-14 DIAGNOSIS — Z6826 Body mass index (BMI) 26.0-26.9, adult: Secondary | ICD-10-CM | POA: Diagnosis not present

## 2021-08-14 DIAGNOSIS — I2510 Atherosclerotic heart disease of native coronary artery without angina pectoris: Secondary | ICD-10-CM | POA: Diagnosis not present

## 2021-08-14 DIAGNOSIS — E785 Hyperlipidemia, unspecified: Secondary | ICD-10-CM | POA: Diagnosis not present

## 2022-01-02 IMAGING — DX DG CHEST 1V PORT
1 series · 2 of 2 positions shown · non-contrast
Comparison: None.

CLINICAL DATA: Chest pain.

EXAM:
PORTABLE CHEST 1 VIEW

[Series 1: chest ap · 0.14mm/px · 2 of 2 slices shown]
[im 1/2]
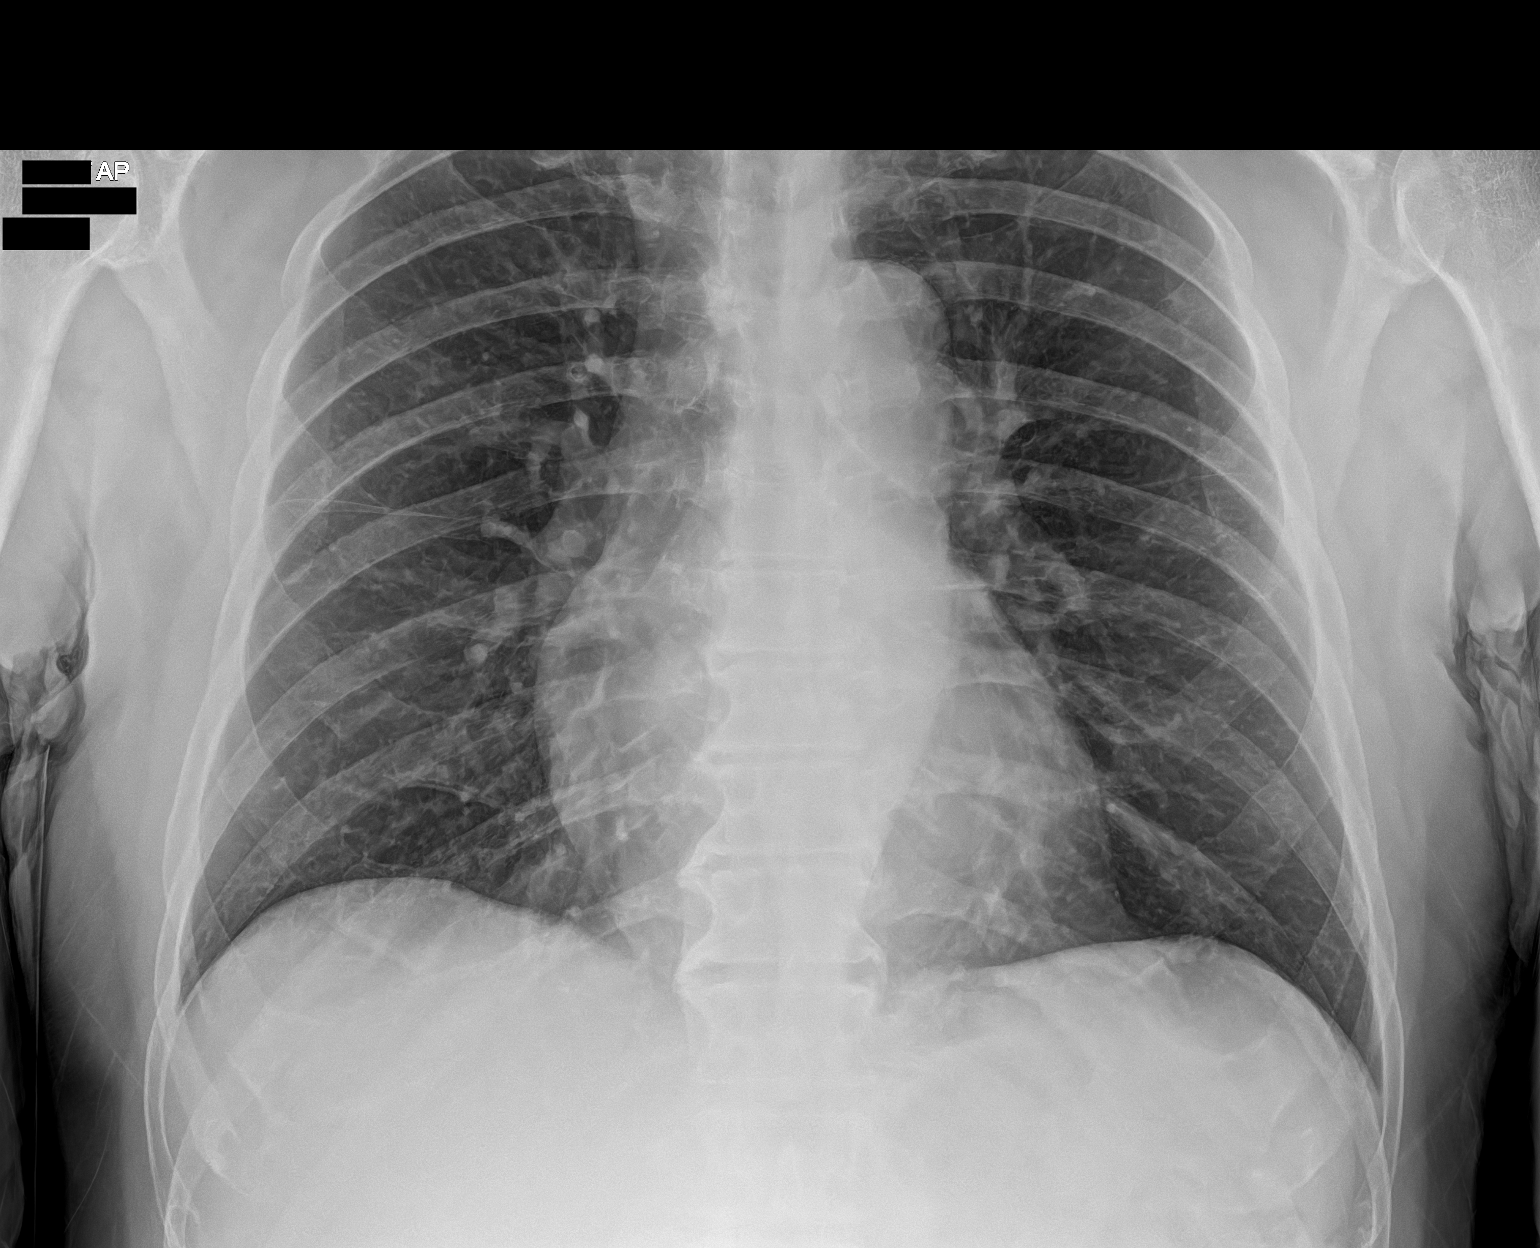
[im 2/2]
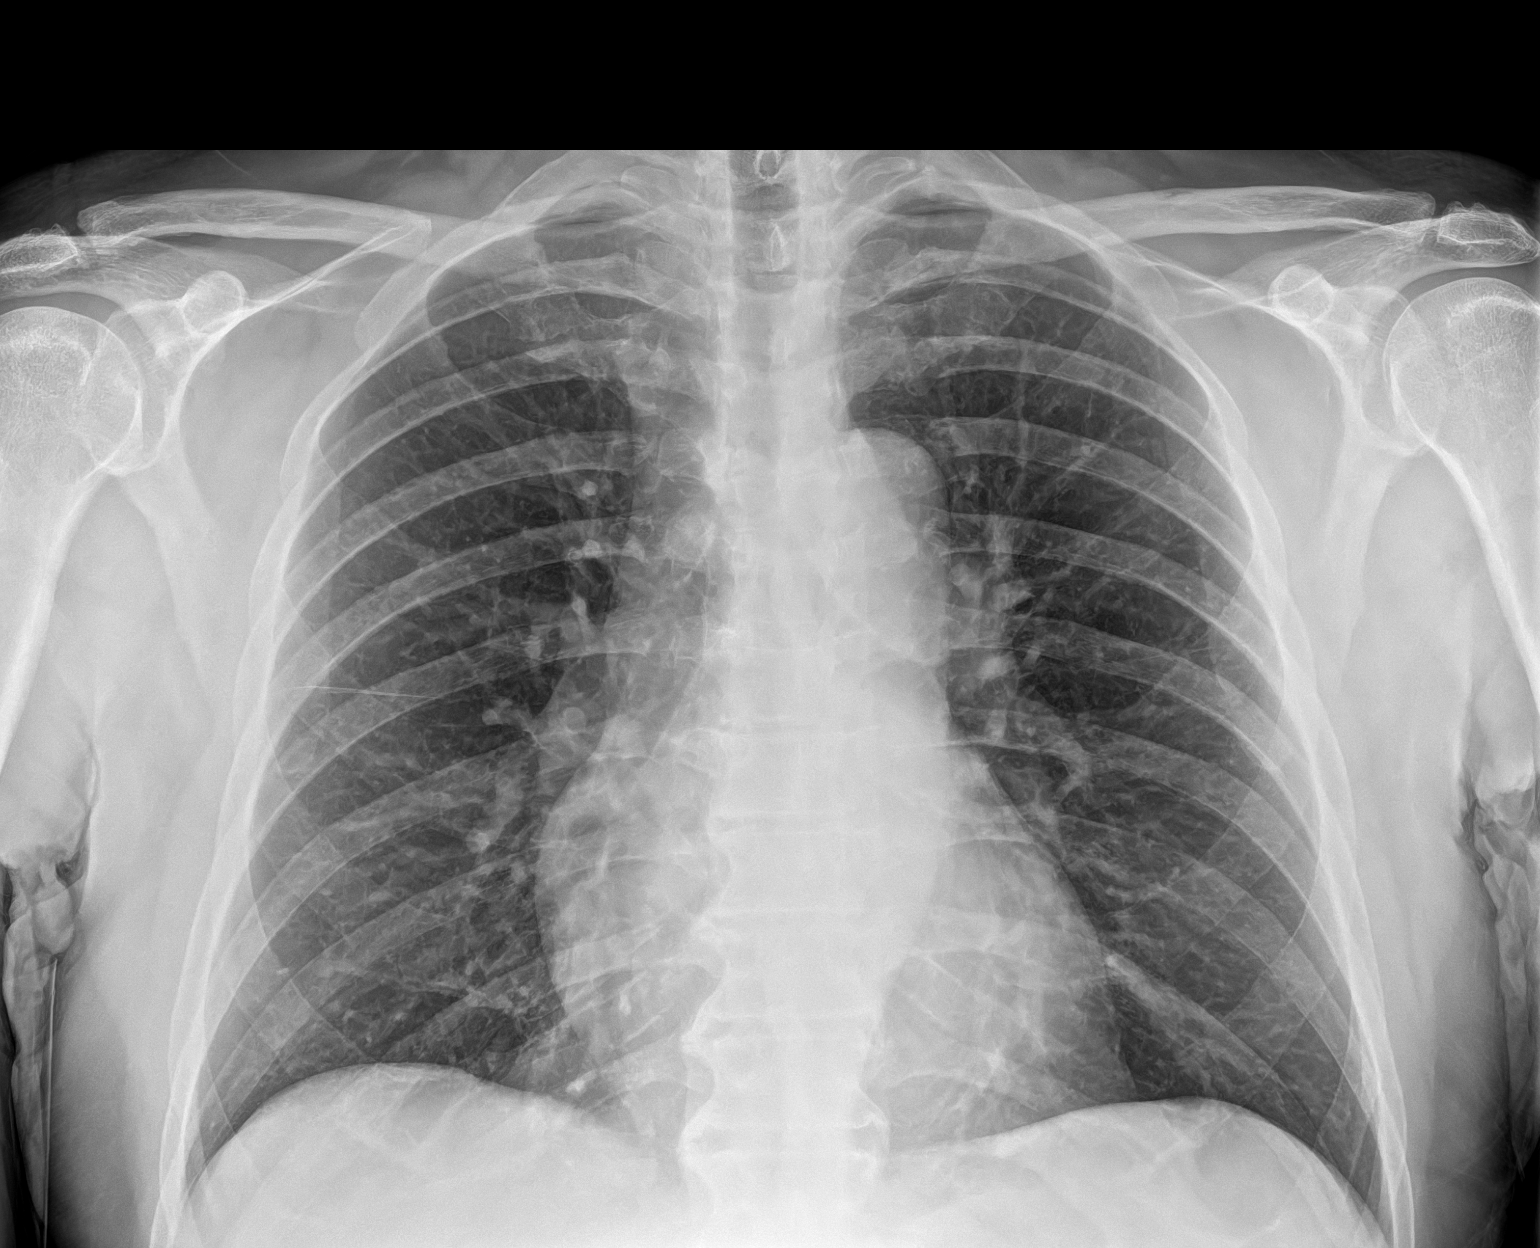

[2 of 2 positions shown; findings below may reference images not displayed]

FINDINGS: The heart size and mediastinal contours are within normal limits.
Both lungs are clear. The visualized skeletal structures are
unremarkable.
IMPRESSION: No active disease.

## 2022-01-18 DIAGNOSIS — N529 Male erectile dysfunction, unspecified: Secondary | ICD-10-CM | POA: Diagnosis not present

## 2022-01-18 DIAGNOSIS — N401 Enlarged prostate with lower urinary tract symptoms: Secondary | ICD-10-CM | POA: Diagnosis not present

## 2022-01-18 DIAGNOSIS — N138 Other obstructive and reflux uropathy: Secondary | ICD-10-CM | POA: Diagnosis not present

## 2022-01-18 DIAGNOSIS — Z8042 Family history of malignant neoplasm of prostate: Secondary | ICD-10-CM | POA: Diagnosis not present

## 2022-01-18 DIAGNOSIS — R972 Elevated prostate specific antigen [PSA]: Secondary | ICD-10-CM | POA: Diagnosis not present

## 2022-02-02 DIAGNOSIS — N1831 Chronic kidney disease, stage 3a: Secondary | ICD-10-CM | POA: Diagnosis not present

## 2022-02-16 DIAGNOSIS — N1831 Chronic kidney disease, stage 3a: Secondary | ICD-10-CM | POA: Diagnosis not present

## 2022-02-16 DIAGNOSIS — I2510 Atherosclerotic heart disease of native coronary artery without angina pectoris: Secondary | ICD-10-CM | POA: Diagnosis not present

## 2022-02-19 ENCOUNTER — Other Ambulatory Visit: Payer: Self-pay | Admitting: Physician Assistant

## 2022-02-28 ENCOUNTER — Encounter: Payer: Self-pay | Admitting: Cardiology

## 2022-02-28 ENCOUNTER — Ambulatory Visit: Payer: Medicare Other | Admitting: Cardiology

## 2022-02-28 VITALS — BP 118/62 | HR 58 | Ht 69.0 in | Wt 173.0 lb

## 2022-02-28 DIAGNOSIS — E782 Mixed hyperlipidemia: Secondary | ICD-10-CM | POA: Diagnosis not present

## 2022-02-28 DIAGNOSIS — I2510 Atherosclerotic heart disease of native coronary artery without angina pectoris: Secondary | ICD-10-CM | POA: Diagnosis not present

## 2022-02-28 NOTE — Patient Instructions (Signed)
Medication Instructions:  Your physician has recommended you make the following change in your medication:  Stop Brilinta on 03/15/2022.   Labwork: None  Testing/Procedures: None  Follow-Up: Follow up with Dr. Wyline Mood in 6 months.   Any Other Special Instructions Will Be Listed Below (If Applicable).     If you need a refill on your cardiac medications before your next appointment, please call your pharmacy.

## 2022-02-28 NOTE — Progress Notes (Signed)
Clinical Summary Philip Daniels is a 71 y.o.male seen today for follow up of the following medical problems.    1.CAD/NSTEMI - admitted 02/2021 with NSTEMI, trop up to 1165 - 02/2021 cath ostial LM 30%, LAD mid 30%, LCX OM1 100% and thrombotic, RCA prox 40%. Received DES to OM1.  - 02/2021 echo 60-65%, no WMAs   - no recent chest pains, no SOB/DOE     - no beta blocker due to bradycardia. 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 LDL 107, started on atorva 80mg  daily.  - hesitant for additional medications.   No chest pain, no SOB/DOE - compliant with meds   2.Hyperlipidemia - Jan 2023: TC 100 TG 109 HDL 38 LDL 42 - he is on crestor 40mg  daily.     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     Past Medical History:  Diagnosis Date   Medical history non-contributory      No Known Allergies   Current Outpatient Medications  Medication Sig Dispense Refill   aspirin 81 MG chewable tablet Chew 1 tablet (81 mg total) by mouth daily.     BRILINTA 90 MG TABS tablet TAKE ONE TABLET BY MOUTH TWICE A DAY 60 tablet 6   Multiple Vitamin (MULTIVITAMIN WITH MINERALS) TABS Take 1 tablet by mouth daily. (Patient not taking: Reported on 07/21/2021)     nitroGLYCERIN (NITROSTAT) 0.4 MG SL tablet Place 1 tablet (0.4 mg total) under the tongue every 5 (five) minutes x 3 doses as needed for chest pain. 25 tablet 12   rosuvastatin (CRESTOR) 40 MG tablet Take 1 tablet (40 mg total) by mouth daily at 6 PM. 30 tablet 11   vitamin C (ASCORBIC ACID) 500 MG tablet Take 500 mg by mouth daily. (Patient not taking: Reported on 07/21/2021)     No current facility-administered medications for this visit.     Past Surgical History:  Procedure Laterality Date   COLONOSCOPY N/A 12/24/2012   Procedure: COLONOSCOPY;  Surgeon: 07/23/2021, MD;  [facility];  Service: Endoscopy;  Laterality: N/A;  730   CORONARY STENT INTERVENTION N/A 03/15/2021   Procedure:  CORONARY STENT INTERVENTION;  Surgeon: Malissa Hippo, MD;  [facility];  Service: Cardiovascular;  Laterality: N/A;   LEFT HEART CATH AND CORONARY ANGIOGRAPHY N/A 03/15/2021   Procedure: LEFT HEART CATH AND CORONARY ANGIOGRAPHY;  Surgeon: Kathleene Hazel, MD;  [facility];  Service: Cardiovascular;  Laterality: N/A;   TONSILLECTOMY  1970     No Known Allergies    Family History  Problem Relation Age of Onset   CAD Father    Colon cancer Paternal Aunt      Social History Mr. Poet reports that he has never smoked. He has never used smokeless tobacco. Mr. Gellert reports no history of alcohol use.   Review of Systems CONSTITUTIONAL: No weight loss, fever, chills, weakness or fatigue.  HEENT: Eyes: No visual loss, blurred vision, double vision or yellow sclerae.No hearing loss, sneezing, congestion, runny nose or sore throat.  SKIN: No rash or itching.  CARDIOVASCULAR: per hpi RESPIRATORY: No shortness of breath, cough or sputum.  GASTROINTESTINAL: No anorexia, nausea, vomiting or diarrhea. No abdominal pain or blood.  GENITOURINARY: No burning on urination, no polyuria NEUROLOGICAL: No headache, dizziness, syncope, paralysis, ataxia, numbness or tingling in the extremities. No change in bowel or bladder control.  MUSCULOSKELETAL: No muscle, back pain, joint  pain or stiffness.  LYMPHATICS: No enlarged nodes. No history of splenectomy.  PSYCHIATRIC: No history of depression or anxiety.  ENDOCRINOLOGIC: No reports of sweating, cold or heat intolerance. No polyuria or polydipsia.  Marland Kitchen   Physical Examination Today's Vitals   02/28/22 1442  BP: 118/62  Pulse: (!) 58  SpO2: 97%  Weight: 173 lb (78.5 kg)  Height: 5\' 9"  (1.753 m)   Body mass index is 25.55 kg/m.  Gen: resting comfortably, no acute distress HEENT: no scleral icterus, pupils equal round and reactive, no palptable cervical adenopathy,  CV: RRR, no m/r/g, no  jvd Resp: Clear to auscultation bilaterally GI: abdomen is soft, non-tender, non-distended, normal bowel sounds, no hepatosplenomegaly MSK: extremities are warm, no edema.  Skin: warm, no rash Neuro:  no focal deficits Psych: appropriate affect   Diagnostic Studies     Assessment and Plan   1.CAD - doing well after recent PCI for NSTEMI in 02/2021 - no beta blocker due to bradycardia. No ACE/ARB due to undelrying renal dysfunction, he has also been hesitant for additional meds - can d/c brillinta 03/15/22 - no symptoms   2. Hyperlipidemia - LDL at goal, continue crestor   F/u 6 months. If doing well could extend to once  a year after that     03/17/22, M.D

## 2022-03-23 ENCOUNTER — Other Ambulatory Visit: Payer: Self-pay | Admitting: Physician Assistant

## 2022-04-23 ENCOUNTER — Ambulatory Visit: Payer: Medicare HMO | Admitting: Cardiology

## 2022-05-29 DIAGNOSIS — I2510 Atherosclerotic heart disease of native coronary artery without angina pectoris: Secondary | ICD-10-CM | POA: Diagnosis not present

## 2022-05-29 DIAGNOSIS — N1831 Chronic kidney disease, stage 3a: Secondary | ICD-10-CM | POA: Diagnosis not present

## 2022-06-08 DIAGNOSIS — N1831 Chronic kidney disease, stage 3a: Secondary | ICD-10-CM | POA: Diagnosis not present

## 2022-06-08 DIAGNOSIS — I2510 Atherosclerotic heart disease of native coronary artery without angina pectoris: Secondary | ICD-10-CM | POA: Diagnosis not present

## 2022-06-18 DIAGNOSIS — M9903 Segmental and somatic dysfunction of lumbar region: Secondary | ICD-10-CM | POA: Diagnosis not present

## 2022-06-18 DIAGNOSIS — S338XXA Sprain of other parts of lumbar spine and pelvis, initial encounter: Secondary | ICD-10-CM | POA: Diagnosis not present

## 2022-06-18 DIAGNOSIS — M9902 Segmental and somatic dysfunction of thoracic region: Secondary | ICD-10-CM | POA: Diagnosis not present

## 2022-06-18 DIAGNOSIS — S233XXA Sprain of ligaments of thoracic spine, initial encounter: Secondary | ICD-10-CM | POA: Diagnosis not present

## 2022-06-21 DIAGNOSIS — M9902 Segmental and somatic dysfunction of thoracic region: Secondary | ICD-10-CM | POA: Diagnosis not present

## 2022-06-21 DIAGNOSIS — S338XXA Sprain of other parts of lumbar spine and pelvis, initial encounter: Secondary | ICD-10-CM | POA: Diagnosis not present

## 2022-06-21 DIAGNOSIS — S233XXA Sprain of ligaments of thoracic spine, initial encounter: Secondary | ICD-10-CM | POA: Diagnosis not present

## 2022-06-21 DIAGNOSIS — M9903 Segmental and somatic dysfunction of lumbar region: Secondary | ICD-10-CM | POA: Diagnosis not present

## 2022-06-28 DIAGNOSIS — M9903 Segmental and somatic dysfunction of lumbar region: Secondary | ICD-10-CM | POA: Diagnosis not present

## 2022-06-28 DIAGNOSIS — S233XXA Sprain of ligaments of thoracic spine, initial encounter: Secondary | ICD-10-CM | POA: Diagnosis not present

## 2022-06-28 DIAGNOSIS — M9902 Segmental and somatic dysfunction of thoracic region: Secondary | ICD-10-CM | POA: Diagnosis not present

## 2022-06-28 DIAGNOSIS — S338XXA Sprain of other parts of lumbar spine and pelvis, initial encounter: Secondary | ICD-10-CM | POA: Diagnosis not present

## 2022-07-12 DIAGNOSIS — M9903 Segmental and somatic dysfunction of lumbar region: Secondary | ICD-10-CM | POA: Diagnosis not present

## 2022-07-12 DIAGNOSIS — S233XXA Sprain of ligaments of thoracic spine, initial encounter: Secondary | ICD-10-CM | POA: Diagnosis not present

## 2022-07-12 DIAGNOSIS — M9902 Segmental and somatic dysfunction of thoracic region: Secondary | ICD-10-CM | POA: Diagnosis not present

## 2022-07-12 DIAGNOSIS — S338XXA Sprain of other parts of lumbar spine and pelvis, initial encounter: Secondary | ICD-10-CM | POA: Diagnosis not present

## 2022-07-26 DIAGNOSIS — S233XXA Sprain of ligaments of thoracic spine, initial encounter: Secondary | ICD-10-CM | POA: Diagnosis not present

## 2022-07-26 DIAGNOSIS — M9902 Segmental and somatic dysfunction of thoracic region: Secondary | ICD-10-CM | POA: Diagnosis not present

## 2022-07-26 DIAGNOSIS — S338XXA Sprain of other parts of lumbar spine and pelvis, initial encounter: Secondary | ICD-10-CM | POA: Diagnosis not present

## 2022-07-26 DIAGNOSIS — M9903 Segmental and somatic dysfunction of lumbar region: Secondary | ICD-10-CM | POA: Diagnosis not present

## 2022-08-02 DIAGNOSIS — R972 Elevated prostate specific antigen [PSA]: Secondary | ICD-10-CM | POA: Diagnosis not present

## 2022-08-02 DIAGNOSIS — N529 Male erectile dysfunction, unspecified: Secondary | ICD-10-CM | POA: Diagnosis not present

## 2022-08-02 DIAGNOSIS — N401 Enlarged prostate with lower urinary tract symptoms: Secondary | ICD-10-CM | POA: Diagnosis not present

## 2022-08-02 DIAGNOSIS — N138 Other obstructive and reflux uropathy: Secondary | ICD-10-CM | POA: Diagnosis not present

## 2022-08-02 DIAGNOSIS — Z8042 Family history of malignant neoplasm of prostate: Secondary | ICD-10-CM | POA: Diagnosis not present

## 2022-08-09 DIAGNOSIS — M9902 Segmental and somatic dysfunction of thoracic region: Secondary | ICD-10-CM | POA: Diagnosis not present

## 2022-08-09 DIAGNOSIS — S233XXA Sprain of ligaments of thoracic spine, initial encounter: Secondary | ICD-10-CM | POA: Diagnosis not present

## 2022-08-09 DIAGNOSIS — S338XXA Sprain of other parts of lumbar spine and pelvis, initial encounter: Secondary | ICD-10-CM | POA: Diagnosis not present

## 2022-08-09 DIAGNOSIS — M9903 Segmental and somatic dysfunction of lumbar region: Secondary | ICD-10-CM | POA: Diagnosis not present

## 2022-08-14 DIAGNOSIS — R001 Bradycardia, unspecified: Secondary | ICD-10-CM | POA: Diagnosis not present

## 2022-08-14 DIAGNOSIS — N1831 Chronic kidney disease, stage 3a: Secondary | ICD-10-CM | POA: Diagnosis not present

## 2022-08-14 DIAGNOSIS — I2510 Atherosclerotic heart disease of native coronary artery without angina pectoris: Secondary | ICD-10-CM | POA: Diagnosis not present

## 2022-08-14 DIAGNOSIS — Z125 Encounter for screening for malignant neoplasm of prostate: Secondary | ICD-10-CM | POA: Diagnosis not present

## 2022-08-14 DIAGNOSIS — Z79899 Other long term (current) drug therapy: Secondary | ICD-10-CM | POA: Diagnosis not present

## 2022-08-21 DIAGNOSIS — I2510 Atherosclerotic heart disease of native coronary artery without angina pectoris: Secondary | ICD-10-CM | POA: Diagnosis not present

## 2022-08-21 DIAGNOSIS — E785 Hyperlipidemia, unspecified: Secondary | ICD-10-CM | POA: Diagnosis not present

## 2022-08-21 DIAGNOSIS — R001 Bradycardia, unspecified: Secondary | ICD-10-CM | POA: Diagnosis not present

## 2022-08-21 DIAGNOSIS — N1831 Chronic kidney disease, stage 3a: Secondary | ICD-10-CM | POA: Diagnosis not present

## 2022-08-21 DIAGNOSIS — I493 Ventricular premature depolarization: Secondary | ICD-10-CM | POA: Diagnosis not present

## 2022-08-30 DIAGNOSIS — M9902 Segmental and somatic dysfunction of thoracic region: Secondary | ICD-10-CM | POA: Diagnosis not present

## 2022-08-30 DIAGNOSIS — S233XXA Sprain of ligaments of thoracic spine, initial encounter: Secondary | ICD-10-CM | POA: Diagnosis not present

## 2022-08-30 DIAGNOSIS — M9903 Segmental and somatic dysfunction of lumbar region: Secondary | ICD-10-CM | POA: Diagnosis not present

## 2022-08-30 DIAGNOSIS — S338XXA Sprain of other parts of lumbar spine and pelvis, initial encounter: Secondary | ICD-10-CM | POA: Diagnosis not present

## 2022-09-05 ENCOUNTER — Encounter: Payer: Self-pay | Admitting: Cardiology

## 2022-09-05 ENCOUNTER — Ambulatory Visit: Payer: Medicare Other | Attending: Cardiology | Admitting: Cardiology

## 2022-09-05 VITALS — BP 122/78 | HR 62 | Ht 69.0 in | Wt 176.0 lb

## 2022-09-05 DIAGNOSIS — E782 Mixed hyperlipidemia: Secondary | ICD-10-CM

## 2022-09-05 DIAGNOSIS — I2510 Atherosclerotic heart disease of native coronary artery without angina pectoris: Secondary | ICD-10-CM

## 2022-09-05 NOTE — Patient Instructions (Signed)
Medication Instructions:  Your physician recommends that you continue on your current medications as directed. Please refer to the Current Medication list given to you today.   Labwork: None  Testing/Procedures: None  Follow-Up: Follow up with Dr. Branch in 6 months.   Any Other Special Instructions Will Be Listed Below (If Applicable).     If you need a refill on your cardiac medications before your next appointment, please call your pharmacy.  

## 2022-09-05 NOTE — Progress Notes (Signed)
Clinical Summary Philip Daniels is a 72 y.o.male seen today for follow up of the following medical problems.    1.CAD/NSTEMI - admitted 02/2021 with NSTEMI, trop up to 1165 - 02/2021 cath ostial LM 30%, LAD mid 30%, LCX OM1 100% and thrombotic, RCA prox 40%. Received DES to OM1.  - 02/2021 echo 60-65%, no WMAs     - no beta blocker due to bradycardia. 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  - historically he has been hesitant for additional medications.   - no chest pain, no SOB/DOE - compliant with meds    2.Hyperlipidemia - Jan 2023: TC 100 TG 109 HDL 38 LDL 42 - Jan 2024 TC 116 TG 95 HDL 39 LDL 59 - he is on crestor 11m daily.      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     nitroGLYCERIN (NITROSTAT) 0.4 MG SL tablet Place 1 tablet (0.4 mg total) under the tongue every 5 (five) minutes x 3 doses as needed for chest pain. 25 tablet 12   rosuvastatin (CRESTOR) 40 MG tablet TAKE ONE TABLET (40 MG TOTAL) BY MOUTH DAILY AT 6 PM. 30 tablet 11   No current facility-administered medications for this visit.     Past Surgical History:  Procedure Laterality Date   COLONOSCOPY N/A 12/24/2012   Procedure: COLONOSCOPY;  Surgeon: NRogene Houston MD;  [facility];  Service: Endoscopy;  Laterality: N/A;  730   CORONARY STENT INTERVENTION N/A 03/15/2021   Procedure: CORONARY STENT INTERVENTION;  Surgeon: MBurnell Blanks MD;  [facility];  Service: Cardiovascular;  Laterality: N/A;   LEFT HEART CATH AND CORONARY ANGIOGRAPHY N/A 03/15/2021   Procedure: LEFT HEART CATH AND CORONARY ANGIOGRAPHY;  Surgeon: MBurnell Blanks MD;  [facility];  Service: Cardiovascular;  Laterality:  N/A;   TONSILLECTOMY  1970     No Known Allergies    Family History  Problem Relation Age of Onset   CAD Father    Colon cancer Paternal Aunt      Social History Mr. FDalesreports that he has never smoked. He has never used smokeless tobacco. Mr. FSigristreports no history of alcohol use.   Review of Systems CONSTITUTIONAL: No weight loss, fever, chills, weakness or fatigue.  HEENT: Eyes: No visual loss, blurred vision, double vision or yellow sclerae.No hearing loss, sneezing, congestion, runny nose or sore throat.  SKIN: No rash or itching.  CARDIOVASCULAR: per hpi RESPIRATORY: No shortness of breath, cough or sputum.  GASTROINTESTINAL: No anorexia, nausea, vomiting or diarrhea. No abdominal pain or blood.  GENITOURINARY: No burning on urination, no polyuria NEUROLOGICAL: No headache, dizziness, syncope, paralysis, ataxia, numbness or tingling in the extremities. No change in bowel or bladder control.  MUSCULOSKELETAL: No muscle, back pain, joint pain or stiffness.  LYMPHATICS: No enlarged nodes. No history of splenectomy.  PSYCHIATRIC: No history of depression or anxiety.  ENDOCRINOLOGIC: No reports of sweating, cold or heat intolerance. No polyuria or polydipsia.  .Marland Kitchen  Physical Examination Today's Vitals   09/05/22 0836  BP: 122/78  Pulse: 62  SpO2: 100%  Weight: 176 lb (79.8 kg)  Height: 5' 9"$  (1.753 m)  Body mass index is 25.99 kg/m.  Gen: resting comfortably, no acute distress HEENT: no scleral icterus, pupils equal round and reactive, no palptable cervical adenopathy,  CV: RRR, no m/r/g, no jvd Resp: Clear to auscultation bilaterally GI: abdomen is soft, non-tender, non-distended, normal bowel sounds, no hepatosplenomegaly MSK: extremities are warm, no edema.  Skin: warm, no rash Neuro:  no focal deficits Psych: appropriate affect     Assessment and Plan   1.CAD - no beta blocker due to bradycardia. No ACE/ARB due to undelrying renal  dysfunction, he has also been hesitant for additional meds - no symptoms, continue current meds   2. Hyperlipidemia -LDL remains at goal, continue current meds         Arnoldo Lenis, M.D.

## 2022-09-11 DIAGNOSIS — K080 Exfoliation of teeth due to systemic causes: Secondary | ICD-10-CM | POA: Diagnosis not present

## 2022-09-20 DIAGNOSIS — M9903 Segmental and somatic dysfunction of lumbar region: Secondary | ICD-10-CM | POA: Diagnosis not present

## 2022-09-20 DIAGNOSIS — M9902 Segmental and somatic dysfunction of thoracic region: Secondary | ICD-10-CM | POA: Diagnosis not present

## 2022-09-20 DIAGNOSIS — S338XXA Sprain of other parts of lumbar spine and pelvis, initial encounter: Secondary | ICD-10-CM | POA: Diagnosis not present

## 2022-09-20 DIAGNOSIS — S233XXA Sprain of ligaments of thoracic spine, initial encounter: Secondary | ICD-10-CM | POA: Diagnosis not present

## 2022-10-04 DIAGNOSIS — S233XXA Sprain of ligaments of thoracic spine, initial encounter: Secondary | ICD-10-CM | POA: Diagnosis not present

## 2022-10-04 DIAGNOSIS — S338XXA Sprain of other parts of lumbar spine and pelvis, initial encounter: Secondary | ICD-10-CM | POA: Diagnosis not present

## 2022-10-04 DIAGNOSIS — M9903 Segmental and somatic dysfunction of lumbar region: Secondary | ICD-10-CM | POA: Diagnosis not present

## 2022-10-04 DIAGNOSIS — M9902 Segmental and somatic dysfunction of thoracic region: Secondary | ICD-10-CM | POA: Diagnosis not present

## 2022-10-18 DIAGNOSIS — S233XXA Sprain of ligaments of thoracic spine, initial encounter: Secondary | ICD-10-CM | POA: Diagnosis not present

## 2022-10-18 DIAGNOSIS — M9902 Segmental and somatic dysfunction of thoracic region: Secondary | ICD-10-CM | POA: Diagnosis not present

## 2022-10-18 DIAGNOSIS — S338XXA Sprain of other parts of lumbar spine and pelvis, initial encounter: Secondary | ICD-10-CM | POA: Diagnosis not present

## 2022-10-18 DIAGNOSIS — M9903 Segmental and somatic dysfunction of lumbar region: Secondary | ICD-10-CM | POA: Diagnosis not present

## 2022-11-29 ENCOUNTER — Encounter (INDEPENDENT_AMBULATORY_CARE_PROVIDER_SITE_OTHER): Payer: Self-pay | Admitting: *Deleted

## 2022-12-27 ENCOUNTER — Telehealth (INDEPENDENT_AMBULATORY_CARE_PROVIDER_SITE_OTHER): Payer: Self-pay | Admitting: Gastroenterology

## 2022-12-27 NOTE — Telephone Encounter (Signed)
Who is your primary care physician: Dr.Roy Fagan  Reasons for the colonoscopy: 10 year recall  Have you had a colonoscopy before?  Yes 10 years  Do you have family history of colon cancer? Yes aunt (my fathers sister)  Previous colonoscopy with polyps removed? no  Do you have a history colorectal cancer?   no  Are you diabetic? If yes, Type 1 or Type 2?    no  Do you have a prosthetic or mechanical heart valve? no  Do you have a pacemaker/defibrillator?   no  Have you had endocarditis/atrial fibrillation? no  Have you had joint replacement within the last 12 months?  no  Do you tend to be constipated or have to use laxatives? no  Do you have any history of drugs or alchohol?  no  Do you use supplemental oxygen?  no  Have you had a stroke or heart attack within the last 6 months?no  Do you take weight loss medication? no  Do you take any blood-thinning medications such as: (aspirin, warfarin, Plavix, Aggrenox)  Bayer Aspirin 81 mg Dr.Branch  If yes we need the name, milligram, dosage and who is prescribing doctor  Current Outpatient Medications on File Prior to Visit  Medication Sig Dispense Refill   aspirin 81 MG chewable tablet Chew 1 tablet (81 mg total) by mouth daily.     nitroGLYCERIN (NITROSTAT) 0.4 MG SL tablet Place 1 tablet (0.4 mg total) under the tongue every 5 (five) minutes x 3 doses as needed for chest pain. 25 tablet 12   rosuvastatin (CRESTOR) 40 MG tablet TAKE ONE TABLET (40 MG TOTAL) BY MOUTH DAILY AT 6 PM. 30 tablet 11   No current facility-administered medications on file prior to visit.    No Known Allergies   Pharmacy: [facility]  Primary Insurance Name: Ezequiel Essex  Best number where you can be reached: [phone]

## 2022-12-31 NOTE — Telephone Encounter (Signed)
Left message that we do not currently have a room to schedule him in. Will call pt once we have an ASA 3 open

## 2022-12-31 NOTE — Telephone Encounter (Signed)
Can schedule him in any room Thanks

## 2023-01-01 NOTE — Telephone Encounter (Signed)
Left message to return call 

## 2023-01-02 NOTE — Telephone Encounter (Signed)
Left message to return call 

## 2023-01-04 ENCOUNTER — Encounter (INDEPENDENT_AMBULATORY_CARE_PROVIDER_SITE_OTHER): Payer: Self-pay

## 2023-01-04 NOTE — Telephone Encounter (Signed)
Unable to reach pt. Will send letter.

## 2023-01-23 NOTE — Telephone Encounter (Signed)
Pt left message returning call. Pt would like to be schedule in middle to late November.

## 2023-02-07 DIAGNOSIS — R972 Elevated prostate specific antigen [PSA]: Secondary | ICD-10-CM | POA: Diagnosis not present

## 2023-02-07 DIAGNOSIS — N401 Enlarged prostate with lower urinary tract symptoms: Secondary | ICD-10-CM | POA: Diagnosis not present

## 2023-02-07 DIAGNOSIS — Z8042 Family history of malignant neoplasm of prostate: Secondary | ICD-10-CM | POA: Diagnosis not present

## 2023-02-07 DIAGNOSIS — N529 Male erectile dysfunction, unspecified: Secondary | ICD-10-CM | POA: Diagnosis not present

## 2023-03-13 DIAGNOSIS — K080 Exfoliation of teeth due to systemic causes: Secondary | ICD-10-CM | POA: Diagnosis not present

## 2023-04-02 ENCOUNTER — Ambulatory Visit: Payer: Medicare Other | Admitting: Cardiology

## 2023-04-02 NOTE — Progress Notes (Deleted)
Clinical Summary Mr. Moyo is a 72 y.o.male  seen today for follow up of the following medical problems.    1.CAD/NSTEMI - admitted 02/2021 with NSTEMI, trop up to 1165 - 02/2021 cath ostial LM 30%, LAD mid 30%, LCX OM1 100% and thrombotic, RCA prox 40%. Received DES to OM1.  - 02/2021 echo 60-65%, no WMAs     - no beta blocker due to bradycardia. 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  - historically he has been hesitant for additional medications.   - no chest pain, no SOB/DOE - compliant with meds     2.Hyperlipidemia - Jan 2023: TC 100 TG 109 HDL 38 LDL 42 - Jan 2024 TC 116 TG 95 HDL 39 LDL 59 - he is on crestor 40mg  daily.      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     nitroGLYCERIN (NITROSTAT) 0.4 MG SL tablet Place 1 tablet (0.4 mg total) under the tongue every 5 (five) minutes x 3 doses as needed for chest pain. 25 tablet 12   rosuvastatin (CRESTOR) 40 MG tablet TAKE ONE TABLET (40 MG TOTAL) BY MOUTH DAILY AT 6 PM. 30 tablet 11   No current facility-administered medications for this visit.     Past Surgical History:  Procedure Laterality Date   COLONOSCOPY N/A 12/24/2012   Procedure: COLONOSCOPY;  Surgeon: Malissa Hippo, MD;  [facility];  Service: Endoscopy;  Laterality: N/A;  730   CORONARY STENT INTERVENTION N/A 03/15/2021   Procedure: CORONARY STENT INTERVENTION;  Surgeon: Kathleene Hazel, MD;  [facility];  Service: Cardiovascular;  Laterality: N/A;   LEFT HEART CATH AND CORONARY ANGIOGRAPHY N/A 03/15/2021   Procedure: LEFT HEART CATH AND CORONARY ANGIOGRAPHY;  Surgeon: Kathleene Hazel, MD;  [facility];  Service: Cardiovascular;   Laterality: N/A;   TONSILLECTOMY  1970     No Known Allergies    Family History  Problem Relation Age of Onset   CAD Father    Colon cancer Paternal Aunt      Social History Mr. Behr reports that he has never smoked. He has never used smokeless tobacco. Mr. Parmeter reports no history of alcohol use.   Review of Systems CONSTITUTIONAL: No weight loss, fever, chills, weakness or fatigue.  HEENT: Eyes: No visual loss, blurred vision, double vision or yellow sclerae.No hearing loss, sneezing, congestion, runny nose or sore throat.  SKIN: No rash or itching.  CARDIOVASCULAR:  RESPIRATORY: No shortness of breath, cough or sputum.  GASTROINTESTINAL: No anorexia, nausea, vomiting or diarrhea. No abdominal pain or blood.  GENITOURINARY: No burning on urination, no polyuria NEUROLOGICAL: No headache, dizziness, syncope, paralysis, ataxia, numbness or tingling in the extremities. No change in bowel or bladder control.  MUSCULOSKELETAL: No muscle, back pain, joint pain or stiffness.  LYMPHATICS: No enlarged nodes. No history of splenectomy.  PSYCHIATRIC: No history of depression or anxiety.  ENDOCRINOLOGIC: No reports of sweating, cold or heat intolerance. No polyuria or polydipsia.  Marland Kitchen   Physical Examination There were no vitals filed for this visit. There were no vitals filed for this visit.  Gen: resting comfortably, no acute distress HEENT: no scleral icterus,  pupils equal round and reactive, no palptable cervical adenopathy,  CV Resp: Clear to auscultation bilaterally GI: abdomen is soft, non-tender, non-distended, normal bowel sounds, no hepatosplenomegaly MSK: extremities are warm, no edema.  Skin: warm, no rash Neuro:  no focal deficits Psych: appropriate affect   Diagnostic Studies     Assessment and Plan  1.CAD - no beta blocker due to bradycardia. No ACE/ARB due to undelrying renal dysfunction, he has also been hesitant for additional meds - no symptoms,  continue current meds   2. Hyperlipidemia -LDL remains at goal, continue current meds      Antoine Poche, M.D., F.A.C.C.

## 2023-04-03 ENCOUNTER — Encounter: Payer: Self-pay | Admitting: Student

## 2023-04-03 ENCOUNTER — Ambulatory Visit: Payer: Medicare Other | Attending: Cardiology | Admitting: Student

## 2023-04-03 VITALS — BP 112/64 | HR 58 | Ht 69.5 in | Wt 173.2 lb

## 2023-04-03 DIAGNOSIS — N1831 Chronic kidney disease, stage 3a: Secondary | ICD-10-CM

## 2023-04-03 DIAGNOSIS — I2510 Atherosclerotic heart disease of native coronary artery without angina pectoris: Secondary | ICD-10-CM | POA: Diagnosis not present

## 2023-04-03 DIAGNOSIS — E785 Hyperlipidemia, unspecified: Secondary | ICD-10-CM | POA: Diagnosis not present

## 2023-04-03 MED ORDER — ROSUVASTATIN CALCIUM 40 MG PO TABS: 40.0000 mg | ORAL_TABLET | Freq: Every day | ORAL | 3 refills | Status: AC

## 2023-04-03 MED ORDER — NITROGLYCERIN 0.4 MG SL SUBL: 0.4000 mg | SUBLINGUAL_TABLET | SUBLINGUAL | 2 refills | Status: AC | PRN

## 2023-04-03 NOTE — Patient Instructions (Signed)
Medication Instructions:  Your physician recommends that you continue on your current medications as directed. Please refer to the Current Medication list given to you today.  *If you need a refill on your cardiac medications before your next appointment, please call your pharmacy*   Lab Work: NONE   If you have labs (blood work) drawn today and your tests are completely normal, you will receive your results only by: MyChart Message (if you have MyChart) OR A paper copy in the mail If you have any lab test that is abnormal or we need to change your treatment, we will call you to review the results.   Testing/Procedures: NONE    Follow-Up: At [facility], you and your health needs are our priority.  As part of our continuing mission to provide you with exceptional heart care, we have created designated Provider Care Teams.  These Care Teams include your primary Cardiologist (physician) and Advanced Practice Providers (APPs -  Physician Assistants and Nurse Practitioners) who all work together to provide you with the care you need, when you need it.  We recommend signing up for the patient portal called "MyChart".  Sign up information is provided on this After Visit Summary.  MyChart is used to connect with patients for Virtual Visits (Telemedicine).  Patients are able to view lab/test results, encounter notes, upcoming appointments, etc.  Non-urgent messages can be sent to your provider as well.   To learn more about what you can do with MyChart, go to [email or web address].    Your next appointment:   6 month(s)  Provider:   Jonathan Branch, MD    Other Instructions Thank you for choosing [facility]!    

## 2023-04-03 NOTE — Progress Notes (Signed)
Cardiology Office Note    Date:  04/03/2023  ID:  Philip Daniels, Philip Daniels 08/16/1950, MRN [id number] Cardiologist: Dina Rich, MD    History of Present Illness:    Philip Daniels is a 72 y.o. male with past medical history of CAD (s/p NSTEMI in 02/2021 with DES to OM1), baseline bradycardia, Stage 3 CKD and HLD who presents to the office today for 64-month follow-up.  He was examined by Dr. Wyline Mood in 08/2022 and denied any recent anginal symptoms.  He had not been on a beta-blocker due to baseline bradycardia and was not on an ACE-I or ARB due to underlying renal dysfunction. He was continued on ASA 81 mg daily, Crestor 40 mg daily and PRN SL NTG.   In talking with the patient today, he reports overall doing well since his last office visit. 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 No recent palpitations, orthopnea, PND or pitting edema.  He has not had to utilize SL NTG. He does check his blood pressure on occasion at home and says this has been well-controlled.  Studies Reviewed:   EKG: EKG is ordered today and demonstrates    EKG Interpretation Date/Time:  Wednesday April 03 2023 13:08:36 EDT Ventricular Rate:  56 PR Interval:  150 QRS Duration:  90 QT Interval:  388 QTC Calculation: 374 R Axis:   -27  Text Interpretation: Sinus bradycardia Minimal voltage criteria for LVH, may be normal variant ( R in aVL ) TWI along AVL noted on prior tracings. No acute changes. Reconfirmed by Kesler An ([id number]) on 04/03/2023 1:43:10 PM       Echocardiogram: 02/2021 IMPRESSIONS     1. Left ventricular ejection fraction, by estimation, is 60 to 65%. The  left ventricle has normal function. The left ventricle has no regional  wall motion abnormalities. Left ventricular diastolic parameters were  normal.   2. Right ventricular systolic function is normal. The right ventricular  size is normal.  There is normal pulmonary artery systolic pressure.   3. The mitral valve is normal in structure. Trivial mitral valve  regurgitation. No evidence of mitral stenosis.   4. The aortic valve is tricuspid. There is moderate calcification of the  aortic valve. There is moderate thickening of the aortic valve. Aortic  valve regurgitation is mild. No aortic stenosis is present.   5. The inferior vena cava is normal in size with greater than 50%  respiratory variability, suggesting right atrial pressure of 3 mmHg.   Cardiac Catheterization: 02/2021   Prox RCA lesion is 40% stenosed.   Mid LAD lesion is 30% stenosed.   Ost LM lesion is 30% stenosed.   1st Mrg lesion is 100% stenosed.   A drug-eluting stent was successfully placed using a SYNERGY XD [id number].   Post intervention, there is a 0% residual stenosis.   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 Start high intensity statin. No beta blocker with bradycardia. Echo later today to assess LVEF. BMET in am. Gently hydration post cath.    Physical Exam:   VS:  BP 112/64   Pulse (!) 58   Ht 5' 9.5" (1.765 m)   Wt 173 lb 3.2 oz (78.6  kg)   SpO2 97%   BMI 25.21 kg/m    Wt Readings from Last 3 Encounters:  04/03/23 173 lb 3.2 oz (78.6 kg)  09/05/22 176 lb (79.8 kg)  02/28/22 173 lb (78.5 kg)     GEN: Well nourished, well developed male appearing in no acute distress NECK: No JVD; No carotid bruits CARDIAC: RRR, no murmurs, rubs, gallops RESPIRATORY:  Clear to auscultation without rales, wheezing or rhonchi  ABDOMEN: Appears non-distended. No obvious abdominal masses. EXTREMITIES: No clubbing or cyanosis. No pitting edema.  Distal pedal pulses are 2+  bilaterally.   Assessment and Plan:   1. CAD - He is s/p NSTEMI in 02/2021 with DES to OM1. He remains active at baseline and denies any recent anginal symptoms. We reviewed warning signs to monitor for.  Continue current medical therapy with ASA 81 mg daily and Crestor 40 mg daily. Will also provide an updated Rx for SL NTG. He is not on a beta-blocker due to baseline HR in the 50's.   2. HLD - FLP in 07/2022 showed total cholesterol 116, triglycerides 95, HDL 39 and LDL 59.  Continue current medical therapy with Crestor 40 mg daily.  3. Stage 3 CKD - Creatinine was at 1.4 when checked by his PCP in 07/2022 which is close to his known baseline. We reviewed the importance of avoiding NSAIDS.   Signed, Ellsworth Lennox, PA-C

## 2023-05-21 DIAGNOSIS — R972 Elevated prostate specific antigen [PSA]: Secondary | ICD-10-CM | POA: Diagnosis not present

## 2023-05-30 MED ORDER — PEG 3350-KCL-NA BICARB-NACL 420 G PO SOLR: 4000.0000 mL | Freq: Once | ORAL | 0 refills | Status: AC

## 2023-05-30 NOTE — Addendum Note (Signed)
Addended by: Armstead Peaks on: 05/30/2023 12:25 PM   Modules accepted: Orders

## 2023-05-30 NOTE — Telephone Encounter (Signed)
Patient called in as he has not heard from anyone to schedule procedure. I have scheduled him for 11/20. Aware will send rx for prep to pharmacy. Instructions to be mailed.

## 2023-05-30 NOTE — Telephone Encounter (Signed)
Questionnaire from recall, no referral needed  

## 2023-06-12 ENCOUNTER — Encounter (INDEPENDENT_AMBULATORY_CARE_PROVIDER_SITE_OTHER): Payer: Self-pay | Admitting: *Deleted

## 2023-06-12 ENCOUNTER — Encounter (HOSPITAL_COMMUNITY): Payer: Self-pay | Admitting: Gastroenterology

## 2023-06-12 ENCOUNTER — Encounter (HOSPITAL_COMMUNITY)
Admission: RE | Disposition: A | Discharge status: Home / Self Care | Payer: Self-pay | Source: Ambulatory Visit | Attending: Gastroenterology

## 2023-06-12 ENCOUNTER — Ambulatory Visit (HOSPITAL_COMMUNITY): Payer: Medicare Other | Admitting: Anesthesiology

## 2023-06-12 ENCOUNTER — Ambulatory Visit (HOSPITAL_COMMUNITY): Payer: Self-pay | Admitting: Anesthesiology

## 2023-06-12 ENCOUNTER — Ambulatory Visit (HOSPITAL_COMMUNITY)
Admission: RE | Admit: 2023-06-12 | Discharge: 2023-06-12 | Disposition: A | Discharge status: Home / Self Care | Payer: Medicare Other | Source: Ambulatory Visit | Attending: Gastroenterology | Admitting: Gastroenterology

## 2023-06-12 ENCOUNTER — Other Ambulatory Visit: Payer: Self-pay

## 2023-06-12 DIAGNOSIS — D124 Benign neoplasm of descending colon: Secondary | ICD-10-CM | POA: Insufficient documentation

## 2023-06-12 DIAGNOSIS — I252 Old myocardial infarction: Secondary | ICD-10-CM | POA: Insufficient documentation

## 2023-06-12 DIAGNOSIS — Z139 Encounter for screening, unspecified: Secondary | ICD-10-CM | POA: Diagnosis not present

## 2023-06-12 DIAGNOSIS — I10 Essential (primary) hypertension: Secondary | ICD-10-CM | POA: Insufficient documentation

## 2023-06-12 DIAGNOSIS — Z800 Family history of malignant neoplasm of digestive organs: Secondary | ICD-10-CM | POA: Insufficient documentation

## 2023-06-12 DIAGNOSIS — K648 Other hemorrhoids: Secondary | ICD-10-CM | POA: Diagnosis not present

## 2023-06-12 DIAGNOSIS — D125 Benign neoplasm of sigmoid colon: Secondary | ICD-10-CM

## 2023-06-12 DIAGNOSIS — Z1211 Encounter for screening for malignant neoplasm of colon: Secondary | ICD-10-CM

## 2023-06-12 DIAGNOSIS — K6389 Other specified diseases of intestine: Secondary | ICD-10-CM | POA: Diagnosis not present

## 2023-06-12 DIAGNOSIS — K635 Polyp of colon: Secondary | ICD-10-CM | POA: Diagnosis not present

## 2023-06-12 DIAGNOSIS — I2510 Atherosclerotic heart disease of native coronary artery without angina pectoris: Secondary | ICD-10-CM | POA: Insufficient documentation

## 2023-06-12 HISTORY — PX: POLYPECTOMY: SHX5525

## 2023-06-12 HISTORY — DX: Acute myocardial infarction, unspecified: I21.9

## 2023-06-12 HISTORY — PX: COLONOSCOPY WITH PROPOFOL: SHX5780

## 2023-06-12 LAB — HM COLONOSCOPY

## 2023-06-12 SURGERY — COLONOSCOPY WITH PROPOFOL
Anesthesia: General

## 2023-06-12 MED ORDER — LACTATED RINGERS IV SOLN
INTRAVENOUS | Status: DC
  Administered 2023-06-12: 1000 mL via INTRAVENOUS

## 2023-06-12 MED ORDER — PROPOFOL 500 MG/50ML IV EMUL
INTRAVENOUS | Status: DC | PRN
  Administered 2023-06-12: 150 ug/kg/min via INTRAVENOUS

## 2023-06-12 MED ORDER — EPHEDRINE SULFATE-NACL 50-0.9 MG/10ML-% IV SOSY
PREFILLED_SYRINGE | INTRAVENOUS | Status: DC | PRN
  Administered 2023-06-12: 10 mg via INTRAVENOUS

## 2023-06-12 MED ORDER — PROPOFOL 10 MG/ML IV BOLUS
INTRAVENOUS | Status: DC | PRN
  Administered 2023-06-12: 60 mg via INTRAVENOUS
  Administered 2023-06-12: 20 mg via INTRAVENOUS

## 2023-06-12 NOTE — Discharge Instructions (Signed)
You are being discharged to home.  Resume your previous diet.  We are waiting for your pathology results.  Your physician has recommended a repeat colonoscopy for surveillance based on pathology results.  

## 2023-06-12 NOTE — Transfer of Care (Signed)
Immediate Anesthesia Transfer of Care Note  Patient: Philip Daniels  Procedure(s) Performed: COLONOSCOPY WITH PROPOFOL POLYPECTOMY  Patient Location: PACU  Anesthesia Type:General  Level of Consciousness: awake, alert , and oriented  Airway & Oxygen Therapy: Patient Spontanous Breathing  Post-op Assessment: Report given to RN and Post -op Vital signs reviewed and stable  Post vital signs: Reviewed and stable  Last Vitals:  Vitals Value Taken Time  BP 91/37 06/12/23 0921  Temp 36.4 C 06/12/23 0921  Pulse 58 06/12/23 0921  Resp 16 06/12/23 0921  SpO2 95 % 06/12/23 0921    Last Pain:  Vitals:   06/12/23 0921  TempSrc: Oral  PainSc:       Patients Stated Pain Goal: 10 (06/12/23 0754)  Complications: No notable events documented.

## 2023-06-12 NOTE — Anesthesia Preprocedure Evaluation (Signed)
Anesthesia Evaluation  Patient identified by MRN, date of birth, ID band Patient awake    Reviewed: Allergy & Precautions, H&P , NPO status , Patient's Chart, lab work & pertinent test results, reviewed documented beta blocker date and time   Airway Mallampati: II  TM Distance: >3 FB Neck ROM: full    Dental no notable dental hx.    Pulmonary neg pulmonary ROS   Pulmonary exam normal breath sounds clear to auscultation       Cardiovascular Exercise Tolerance: Good hypertension, + CAD and + Past MI  negative cardio ROS  Rhythm:regular Rate:Normal     Neuro/Psych negative neurological ROS  negative psych ROS   GI/Hepatic negative GI ROS, Neg liver ROS,,,  Endo/Other  negative endocrine ROS    Renal/GU negative Renal ROS  negative genitourinary   Musculoskeletal   Abdominal   Peds  Hematology negative hematology ROS (+)   Anesthesia Other Findings   Reproductive/Obstetrics negative OB ROS                             Anesthesia Physical Anesthesia Plan  ASA: 3  Anesthesia Plan: General   Post-op Pain Management:    Induction:   PONV Risk Score and Plan: Propofol infusion  Airway Management Planned:   Additional Equipment:   Intra-op Plan:   Post-operative Plan:   Informed Consent: I have reviewed the patients History and Physical, chart, labs and discussed the procedure including the risks, benefits and alternatives for the proposed anesthesia with the patient or authorized representative who has indicated his/her understanding and acceptance.     Dental Advisory Given  Plan Discussed with: CRNA  Anesthesia Plan Comments:        Anesthesia Quick Evaluation

## 2023-06-12 NOTE — H&P (Signed)
Philip Daniels is an 72 y.o. male.   Chief Complaint: Screening colonoscopy HPI: 72 year old male with past medical history of coronary artery disease status post stent placement, hyperlipidemia, coming for screening colonoscopy.  Last colonoscopy in 2014 was within normal limits.  The patient denies having any complaints such as melena, hematochezia, abdominal pain or distention, change in her bowel movement consistency or frequency, no changes in weight recently.  Patient had 1 aunt that had colon cancer.  Past Medical History:  Diagnosis Date   CAD (coronary artery disease)    a. s/p NSTEMI in 02/2021 with DES to OM1   Hyperlipidemia    Myocardial infarction [facility])     Past Surgical History:  Procedure Laterality Date   COLONOSCOPY N/A 12/24/2012   Procedure: COLONOSCOPY;  Surgeon: Malissa Hippo, MD;  [facility];  Service: Endoscopy;  Laterality: N/A;  730   CORONARY STENT INTERVENTION N/A 03/15/2021   Procedure: CORONARY STENT INTERVENTION;  Surgeon: Kathleene Hazel, MD;  Location: [facility] INVASIVE CV LAB;  Service: Cardiovascular;  Laterality: N/A;   LEFT HEART CATH AND CORONARY ANGIOGRAPHY N/A 03/15/2021   Procedure: LEFT HEART CATH AND CORONARY ANGIOGRAPHY;  Surgeon: Kathleene Hazel, MD;  Location: [facility] INVASIVE CV LAB;  Service: Cardiovascular;  Laterality: N/A;   TONSILLECTOMY  1970    Family History  Problem Relation Age of Onset   Heart attack Father    Cancer Father    CAD Father    Colon cancer Paternal Aunt    Social History:  reports that he has never smoked. He has never used smokeless tobacco. He reports that he does not drink alcohol and does not use drugs.  Allergies: No Known Allergies  Medications Prior to Admission  Medication Sig Dispense Refill   aspirin 81 MG chewable tablet Chew 1 tablet (81 mg total) by mouth daily.     Multiple Vitamin (MULTIVITAMIN) capsule Take 1 capsule by mouth daily.     nitroGLYCERIN (NITROSTAT) 0.4 MG SL  tablet Place 1 tablet (0.4 mg total) under the tongue every 5 (five) minutes x 3 doses as needed for chest pain. 25 tablet 2   rosuvastatin (CRESTOR) 40 MG tablet Take 1 tablet (40 mg total) by mouth daily. 90 tablet 3    No results found for this or any previous visit (from the past 48 hour(s)). No results found.  Review of Systems  All other systems reviewed and are negative.   Blood pressure 138/70, pulse (!) 55, temperature 98.2 F (36.8 C), temperature source Oral, resp. rate 19, height 5\' 9"  (1.753 m), weight 74.8 kg, SpO2 97%. Physical Exam  GENERAL: The patient is AO x3, in no acute distress. HEENT: Head is normocephalic and atraumatic. EOMI are intact. Mouth is well hydrated and without lesions. NECK: Supple. No masses LUNGS: Clear to auscultation. No presence of rhonchi/wheezing/rales. Adequate chest expansion HEART: RRR, normal s1 and s2. ABDOMEN: Soft, nontender, no guarding, no peritoneal signs, and nondistended. BS +. No masses. EXTREMITIES: Without any cyanosis, clubbing, rash, lesions or edema. NEUROLOGIC: AOx3, no focal motor deficit. SKIN: no jaundice, no rashes  Assessment/Plan  72 year old male with past medical history of coronary artery disease status post stent placement, hyperlipidemia, coming for screening colonoscopy.  Will proceed with colonoscopy.  Dolores Frame, MD 06/12/2023, 8:47 AM

## 2023-06-12 NOTE — Op Note (Signed)
[facility] Patient Name: Philip Daniels Procedure Date: 06/12/2023 8:38 AM MRN: [id number] Date of Birth: [birth year] Attending MD: Katrinka Blazing , , [id number] CSN: [id number] Age: 72 Admit Type: Outpatient Procedure:                Colonoscopy Indications:              Screening for colorectal malignant neoplasm Providers:                Katrinka Blazing, Sheran Fava, Zena Amos Referring MD:              Medicines:                Monitored Anesthesia Care Complications:            No immediate complications. Estimated Blood Loss:     Estimated blood loss: none. Procedure:                Pre-Anesthesia Assessment:                           - Prior to the procedure, a History and Physical                            was performed, and patient medications, allergies                            and sensitivities were reviewed. The patient's                            tolerance of previous anesthesia was reviewed.                           - The risks and benefits of the procedure and the                            sedation options and risks were discussed with the                            patient. All questions were answered and informed                            consent was obtained.                           - ASA Grade Assessment: II - A patient with mild                            systemic disease.                           After obtaining informed consent, the colonoscope                            was passed under direct vision. Throughout the  procedure, the patient's blood pressure, pulse, and                            oxygen saturations were monitored continuously. The                            [id number]-[id number] ([id number]) scope was introduced through                            the anus and advanced to the the cecum, identified                            by appendiceal orifice and ileocecal valve. The                             colonoscopy was performed without difficulty. The                            patient tolerated the procedure well. The quality                            of the bowel preparation was good. Scope In: 8:54:23 AM Scope Out: 9:18:19 AM Scope Withdrawal Time: 0 hours 16 minutes 22 seconds  Total Procedure Duration: 0 hours 23 minutes 56 seconds  Findings:      The perianal and digital rectal examinations were normal.      Three sessile polyps were found in the sigmoid colon and descending       colon. The polyps were 2 to 6 mm in size. These polyps were removed with       a cold snare. Resection and retrieval were complete.      Non-bleeding internal hemorrhoids were found during retroflexion. The       hemorrhoids were small. Impression:               - Three 2 to 6 mm polyps in the sigmoid colon and                            in the descending colon, removed with a cold snare.                            Resected and retrieved.                           - Non-bleeding internal hemorrhoids. Moderate Sedation:      Per Anesthesia Care Recommendation:           - Discharge patient to home (ambulatory).                           - Resume previous diet.                           - Await pathology results.                           - Repeat colonoscopy for surveillance  based on                            pathology results. Procedure Code(s):        --- Professional ---                           [phone], Colonoscopy, flexible; with removal of                            tumor(s), polyp(s), or other lesion(s) by snare                            technique Diagnosis Code(s):        --- Professional ---                           Z12.11, Encounter for screening for malignant                            neoplasm of colon                           D12.5, Benign neoplasm of sigmoid colon                           D12.4, Benign neoplasm of descending colon                            K64.8, Other hemorrhoids CPT copyright 2022 American Medical Association. All rights reserved. The codes documented in this report are preliminary and upon coder review may  be revised to meet current compliance requirements. Katrinka Blazing, MD Katrinka Blazing,  06/12/2023 9:24:22 AM This report has been signed electronically. Number of Addenda: 0

## 2023-06-13 LAB — SURGICAL PATHOLOGY

## 2023-06-13 NOTE — Anesthesia Postprocedure Evaluation (Signed)
Anesthesia Post Note  Patient: Philip Daniels  Procedure(s) Performed: COLONOSCOPY WITH PROPOFOL POLYPECTOMY  Patient location during evaluation: Phase II Anesthesia Type: General Level of consciousness: awake Pain management: pain level controlled Vital Signs Assessment: post-procedure vital signs reviewed and stable Respiratory status: spontaneous breathing and respiratory function stable Cardiovascular status: blood pressure returned to baseline and stable Postop Assessment: no headache and no apparent nausea or vomiting Anesthetic complications: no Comments: Late entry   No notable events documented.   Last Vitals:  Vitals:   06/12/23 0924 06/12/23 0926  BP: (!) 88/47 (!) 104/55  Pulse: (!) 56   Resp: 16 15  Temp:    SpO2: 96% 95%    Last Pain:  Vitals:   06/12/23 0924  TempSrc:   PainSc: 0-No pain                 Windell Norfolk

## 2023-06-17 DIAGNOSIS — R972 Elevated prostate specific antigen [PSA]: Secondary | ICD-10-CM | POA: Diagnosis not present

## 2023-06-17 DIAGNOSIS — Z135 Encounter for screening for eye and ear disorders: Secondary | ICD-10-CM | POA: Diagnosis not present

## 2023-06-18 ENCOUNTER — Encounter (INDEPENDENT_AMBULATORY_CARE_PROVIDER_SITE_OTHER): Payer: Self-pay | Admitting: *Deleted

## 2023-06-18 ENCOUNTER — Encounter (HOSPITAL_COMMUNITY): Payer: Self-pay | Admitting: Gastroenterology

## 2023-08-23 DIAGNOSIS — R001 Bradycardia, unspecified: Secondary | ICD-10-CM | POA: Diagnosis not present

## 2023-08-23 DIAGNOSIS — Z79899 Other long term (current) drug therapy: Secondary | ICD-10-CM | POA: Diagnosis not present

## 2023-08-23 DIAGNOSIS — I2510 Atherosclerotic heart disease of native coronary artery without angina pectoris: Secondary | ICD-10-CM | POA: Diagnosis not present

## 2023-08-23 DIAGNOSIS — R972 Elevated prostate specific antigen [PSA]: Secondary | ICD-10-CM | POA: Diagnosis not present

## 2023-08-23 DIAGNOSIS — N1831 Chronic kidney disease, stage 3a: Secondary | ICD-10-CM | POA: Diagnosis not present

## 2023-08-23 DIAGNOSIS — E785 Hyperlipidemia, unspecified: Secondary | ICD-10-CM | POA: Diagnosis not present

## 2023-09-02 ENCOUNTER — Other Ambulatory Visit (HOSPITAL_COMMUNITY): Payer: Self-pay | Admitting: Internal Medicine

## 2023-09-02 DIAGNOSIS — N1831 Chronic kidney disease, stage 3a: Secondary | ICD-10-CM | POA: Diagnosis not present

## 2023-09-02 DIAGNOSIS — R7989 Other specified abnormal findings of blood chemistry: Secondary | ICD-10-CM

## 2023-09-02 DIAGNOSIS — E785 Hyperlipidemia, unspecified: Secondary | ICD-10-CM | POA: Diagnosis not present

## 2023-09-02 DIAGNOSIS — I2510 Atherosclerotic heart disease of native coronary artery without angina pectoris: Secondary | ICD-10-CM | POA: Diagnosis not present

## 2023-09-12 DIAGNOSIS — R972 Elevated prostate specific antigen [PSA]: Secondary | ICD-10-CM | POA: Diagnosis not present

## 2023-09-12 DIAGNOSIS — N529 Male erectile dysfunction, unspecified: Secondary | ICD-10-CM | POA: Diagnosis not present

## 2023-09-12 DIAGNOSIS — Z8042 Family history of malignant neoplasm of prostate: Secondary | ICD-10-CM | POA: Diagnosis not present

## 2023-09-12 DIAGNOSIS — N401 Enlarged prostate with lower urinary tract symptoms: Secondary | ICD-10-CM | POA: Diagnosis not present

## 2023-09-20 DIAGNOSIS — K080 Exfoliation of teeth due to systemic causes: Secondary | ICD-10-CM | POA: Diagnosis not present

## 2023-10-08 ENCOUNTER — Telehealth: Payer: Self-pay

## 2023-10-08 NOTE — Progress Notes (Signed)
   10/08/2023  Patient ID: Philip Daniels, male   DOB: 07/19/1951, 73 y.o.   MRN: [id number]   Patient appeared on insurance report for not passing the quality metrics in 2024:  Medication Adherence for Cholesterol (MAC)   Outreach to the patient was Successful. Philip Daniels was not home so unable to verify quantity remaining on medications but he was certain he had enough to last him a few more weeks at least. Fill history was unavailable at time of call however this was updated during documentation.  Meds Tracking:  -Rosuvastatin 40 mg - 90DS on 08/24/23, LDL 73 on 08/24/23. Next fill due 11/22/23.  Plan:  On max dose rosuvastatin, LDL close to goal <55. Possibly elevated due to adherence, gap in days covered from December to February. Will review fill history 11/28/23, labs scheduled for 12/13/23. If not at goal may consider addition of ezetimibe.   Fayette Pho, PharmD

## 2023-10-11 ENCOUNTER — Encounter: Payer: Self-pay | Admitting: *Deleted

## 2023-10-14 ENCOUNTER — Ambulatory Visit: Payer: Medicare Other | Attending: Cardiology | Admitting: Cardiology

## 2023-10-14 NOTE — Progress Notes (Deleted)
 Clinical Summary Philip Daniels is a 73 y.o.male  seen today for follow up of the following medical problems.    1.CAD/NSTEMI - admitted 02/2021 with NSTEMI, trop up to 1165 - 02/2021 cath ostial LM 30%, LAD mid 30%, LCX OM1 100% and thrombotic, RCA prox 40%. Received DES to OM1.  - 02/2021 echo 60-65%, no WMAs     - no beta blocker due to bradycardia. 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  - historically he has been hesitant for additional medications.   - no chest pain, no SOB/DOE - compliant with meds     2.Hyperlipidemia - Jan 2023: TC 100 TG 109 HDL 38 LDL 42 - Jan 2024 TC 116 TG 95 HDL 39 LDL 59 - he is on crestor 40mg  daily.      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 s/p NSTEMI in 02/2021 with DES to OM1   Hyperlipidemia    Myocardial infarction (HCC)      No Known Allergies   Current Outpatient Medications  Medication Sig Dispense Refill   aspirin 81 MG chewable tablet Chew 1 tablet (81 mg total) by mouth daily.     Multiple Vitamin (MULTIVITAMIN) capsule Take 1 capsule by mouth daily.     nitroGLYCERIN (NITROSTAT) 0.4 MG SL tablet Place 1 tablet (0.4 mg total) under the tongue every 5 (five) minutes x 3 doses as needed for chest pain. 25 tablet 2   rosuvastatin (CRESTOR) 40 MG tablet Take 1 tablet (40 mg total) by mouth daily. 90 tablet 3   No current facility-administered medications for this visit.     Past Surgical History:  Procedure Laterality Date   COLONOSCOPY N/A 12/24/2012   Procedure: COLONOSCOPY;  Surgeon: Malissa Hippo, MD;  [facility];  Service: Endoscopy;  Laterality: N/A;  730   COLONOSCOPY WITH PROPOFOL N/A 06/12/2023   Procedure: COLONOSCOPY WITH PROPOFOL;  Surgeon: Dolores Frame, MD;  [facility]: [facility];  Service: Gastroenterology;  Laterality: N/A;  915am, asa 1-2   CORONARY STENT INTERVENTION N/A 03/15/2021    Procedure: CORONARY STENT INTERVENTION;  Surgeon: Kathleene Hazel, MD;  Location: [facility]VAS[facility] CV LAB;  Service: Cardiovascular;  Laterality: N/A;   LEFT HEART CATH AND CORONARY ANGIOGRAPHY N/A 03/15/2021   Procedure: LEFT HEART CATH AND CORONARY ANGIOGRAPHY;  Surgeon: Kathleene Hazel, MD;  Location: [facility] CV LAB;  Service: Cardiovascular;  Laterality: N/A;   POLYPECTOMY  06/12/2023   Procedure: POLYPECTOMY;  Surgeon: Dolores Frame, MD;  [facility];  Service: Gastroenterology;;   TONSILLECTOMY  1970     No Known Allergies    Family History  Problem Relation Age of Onset   Heart attack Father    Cancer Father    CAD Father    Colon cancer Paternal Aunt      Social History Philip Daniels reports that he has never smoked. He has never used smokeless tobacco. Philip Daniels reports no history of alcohol use.   Review of Systems CONSTITUTIONAL: No weight loss, fever, chills, weakness or fatigue.  HEENT: Eyes: No visual loss, blurred vision, double vision or yellow sclerae.No hearing loss, sneezing, congestion, runny nose or sore throat.  SKIN: No rash or itching.  CARDIOVASCULAR:  RESPIRATORY: No shortness of breath, cough or sputum.  GASTROINTESTINAL: No anorexia, nausea, vomiting or diarrhea. No abdominal pain or blood.  GENITOURINARY: No burning on urination, no polyuria NEUROLOGICAL: No headache, dizziness, syncope, paralysis, ataxia, numbness or tingling in the extremities. No change in bowel or bladder control.  MUSCULOSKELETAL: No muscle, back pain, joint pain or stiffness.  LYMPHATICS: No enlarged nodes. No history of splenectomy.  PSYCHIATRIC: No history of depression or anxiety.  ENDOCRINOLOGIC: No reports of sweating, cold or heat intolerance. No polyuria or polydipsia.  Marland Kitchen   Physical Examination There were no vitals filed for this visit. There were no vitals filed for this visit.  Gen: resting comfortably, no acute  distress HEENT: no scleral icterus, pupils equal round and reactive, no palptable cervical adenopathy,  CV Resp: Clear to auscultation bilaterally GI: abdomen is soft, non-tender, non-distended, normal bowel sounds, no hepatosplenomegaly MSK: extremities are warm, no edema.  Skin: warm, no rash Neuro:  no focal deficits Psych: appropriate affect   Diagnostic Studies     Assessment and Plan   1.CAD - no beta blocker due to bradycardia. No ACE/ARB due to undelrying renal dysfunction, he has also been hesitant for additional meds - no symptoms, continue current meds   2. Hyperlipidemia -LDL remains at goal, continue current meds     Antoine Poche, M.D., F.A.C.C.

## 2023-10-17 DIAGNOSIS — N529 Male erectile dysfunction, unspecified: Secondary | ICD-10-CM | POA: Diagnosis not present

## 2023-11-06 DIAGNOSIS — H5203 Hypermetropia, bilateral: Secondary | ICD-10-CM | POA: Diagnosis not present

## 2023-11-28 ENCOUNTER — Telehealth: Payer: Self-pay

## 2023-11-28 NOTE — Progress Notes (Signed)
   11/28/2023  Patient ID: Philip Daniels, male   DOB: October 01, 1950, 73 y.o.   MRN: [id number]   Medication Adherence  Patient confirmed daily dosing of rosuvastatin , refills sent to pharmacy. Documentation in inovaccer.   Flint Hummer, PharmD

## 2023-12-13 DIAGNOSIS — N1832 Chronic kidney disease, stage 3b: Secondary | ICD-10-CM | POA: Diagnosis not present

## 2023-12-20 DIAGNOSIS — I2510 Atherosclerotic heart disease of native coronary artery without angina pectoris: Secondary | ICD-10-CM | POA: Diagnosis not present

## 2023-12-20 DIAGNOSIS — N1831 Chronic kidney disease, stage 3a: Secondary | ICD-10-CM | POA: Diagnosis not present

## 2024-01-14 DIAGNOSIS — S134XXA Sprain of ligaments of cervical spine, initial encounter: Secondary | ICD-10-CM | POA: Diagnosis not present

## 2024-01-14 DIAGNOSIS — S233XXA Sprain of ligaments of thoracic spine, initial encounter: Secondary | ICD-10-CM | POA: Diagnosis not present

## 2024-01-14 DIAGNOSIS — S338XXA Sprain of other parts of lumbar spine and pelvis, initial encounter: Secondary | ICD-10-CM | POA: Diagnosis not present

## 2024-01-21 DIAGNOSIS — S233XXA Sprain of ligaments of thoracic spine, initial encounter: Secondary | ICD-10-CM | POA: Diagnosis not present

## 2024-01-21 DIAGNOSIS — S134XXA Sprain of ligaments of cervical spine, initial encounter: Secondary | ICD-10-CM | POA: Diagnosis not present

## 2024-01-21 DIAGNOSIS — S338XXA Sprain of other parts of lumbar spine and pelvis, initial encounter: Secondary | ICD-10-CM | POA: Diagnosis not present

## 2024-01-30 DIAGNOSIS — S233XXA Sprain of ligaments of thoracic spine, initial encounter: Secondary | ICD-10-CM | POA: Diagnosis not present

## 2024-01-30 DIAGNOSIS — S134XXA Sprain of ligaments of cervical spine, initial encounter: Secondary | ICD-10-CM | POA: Diagnosis not present

## 2024-01-30 DIAGNOSIS — S338XXA Sprain of other parts of lumbar spine and pelvis, initial encounter: Secondary | ICD-10-CM | POA: Diagnosis not present

## 2024-02-07 ENCOUNTER — Encounter: Payer: Self-pay | Admitting: Advanced Practice Midwife

## 2024-02-13 DIAGNOSIS — S338XXA Sprain of other parts of lumbar spine and pelvis, initial encounter: Secondary | ICD-10-CM | POA: Diagnosis not present

## 2024-02-13 DIAGNOSIS — S134XXA Sprain of ligaments of cervical spine, initial encounter: Secondary | ICD-10-CM | POA: Diagnosis not present

## 2024-02-13 DIAGNOSIS — S233XXA Sprain of ligaments of thoracic spine, initial encounter: Secondary | ICD-10-CM | POA: Diagnosis not present

## 2024-03-03 DIAGNOSIS — S134XXA Sprain of ligaments of cervical spine, initial encounter: Secondary | ICD-10-CM | POA: Diagnosis not present

## 2024-03-03 DIAGNOSIS — S233XXA Sprain of ligaments of thoracic spine, initial encounter: Secondary | ICD-10-CM | POA: Diagnosis not present

## 2024-03-03 DIAGNOSIS — S338XXA Sprain of other parts of lumbar spine and pelvis, initial encounter: Secondary | ICD-10-CM | POA: Diagnosis not present

## 2024-03-24 DIAGNOSIS — S233XXA Sprain of ligaments of thoracic spine, initial encounter: Secondary | ICD-10-CM | POA: Diagnosis not present

## 2024-03-24 DIAGNOSIS — S134XXA Sprain of ligaments of cervical spine, initial encounter: Secondary | ICD-10-CM | POA: Diagnosis not present

## 2024-03-24 DIAGNOSIS — S338XXA Sprain of other parts of lumbar spine and pelvis, initial encounter: Secondary | ICD-10-CM | POA: Diagnosis not present

## 2024-03-25 DIAGNOSIS — K080 Exfoliation of teeth due to systemic causes: Secondary | ICD-10-CM | POA: Diagnosis not present

## 2024-04-10 DIAGNOSIS — S233XXA Sprain of ligaments of thoracic spine, initial encounter: Secondary | ICD-10-CM | POA: Diagnosis not present

## 2024-04-10 DIAGNOSIS — S134XXA Sprain of ligaments of cervical spine, initial encounter: Secondary | ICD-10-CM | POA: Diagnosis not present

## 2024-04-10 DIAGNOSIS — S338XXA Sprain of other parts of lumbar spine and pelvis, initial encounter: Secondary | ICD-10-CM | POA: Diagnosis not present

## 2024-04-14 DIAGNOSIS — N1831 Chronic kidney disease, stage 3a: Secondary | ICD-10-CM | POA: Diagnosis not present

## 2024-04-14 DIAGNOSIS — I2510 Atherosclerotic heart disease of native coronary artery without angina pectoris: Secondary | ICD-10-CM | POA: Diagnosis not present

## 2024-04-14 DIAGNOSIS — E785 Hyperlipidemia, unspecified: Secondary | ICD-10-CM | POA: Diagnosis not present

## 2024-04-21 DIAGNOSIS — N1831 Chronic kidney disease, stage 3a: Secondary | ICD-10-CM | POA: Diagnosis not present

## 2024-04-21 DIAGNOSIS — I2510 Atherosclerotic heart disease of native coronary artery without angina pectoris: Secondary | ICD-10-CM | POA: Diagnosis not present

## 2024-04-27 DIAGNOSIS — R351 Nocturia: Secondary | ICD-10-CM | POA: Diagnosis not present

## 2024-04-27 DIAGNOSIS — R972 Elevated prostate specific antigen [PSA]: Secondary | ICD-10-CM | POA: Diagnosis not present

## 2024-04-27 DIAGNOSIS — N529 Male erectile dysfunction, unspecified: Secondary | ICD-10-CM | POA: Diagnosis not present

## 2024-04-28 DIAGNOSIS — S338XXA Sprain of other parts of lumbar spine and pelvis, initial encounter: Secondary | ICD-10-CM | POA: Diagnosis not present

## 2024-04-28 DIAGNOSIS — S134XXA Sprain of ligaments of cervical spine, initial encounter: Secondary | ICD-10-CM | POA: Diagnosis not present

## 2024-04-28 DIAGNOSIS — S233XXA Sprain of ligaments of thoracic spine, initial encounter: Secondary | ICD-10-CM | POA: Diagnosis not present

## 2024-05-06 ENCOUNTER — Encounter (INDEPENDENT_AMBULATORY_CARE_PROVIDER_SITE_OTHER): Payer: Self-pay | Admitting: Gastroenterology
